# Patient Record
Sex: Male | Born: 1935 | Race: Black or African American | Hispanic: No | Smoking: Former smoker
Health system: Southern US, Community
[De-identification: ages and names within clinical notes are randomized; demographics above are authoritative.]

## PROBLEM LIST (undated history)

## (undated) DIAGNOSIS — I639 Cerebral infarction, unspecified: Secondary | ICD-10-CM

## (undated) DIAGNOSIS — Z5189 Encounter for other specified aftercare: Secondary | ICD-10-CM

## (undated) DIAGNOSIS — I10 Essential (primary) hypertension: Secondary | ICD-10-CM

## (undated) DIAGNOSIS — E785 Hyperlipidemia, unspecified: Secondary | ICD-10-CM

## (undated) DIAGNOSIS — E611 Iron deficiency: Secondary | ICD-10-CM

## (undated) HISTORY — PX: BACK SURGERY: SHX140

---

## 1998-02-14 ENCOUNTER — Emergency Department (HOSPITAL_COMMUNITY): Admission: EM | Admit: 1998-02-14 | Discharge: 1998-02-14 | Payer: Self-pay | Admitting: Emergency Medicine

## 1998-03-10 ENCOUNTER — Ambulatory Visit (HOSPITAL_COMMUNITY): Admission: RE | Admit: 1998-03-10 | Discharge: 1998-03-10 | Payer: Self-pay | Admitting: Family Medicine

## 1998-03-10 ENCOUNTER — Encounter: Payer: Self-pay | Admitting: Family Medicine

## 1998-03-17 ENCOUNTER — Encounter: Admission: RE | Admit: 1998-03-17 | Discharge: 1998-06-15 | Payer: Self-pay | Admitting: Family Medicine

## 1998-03-25 ENCOUNTER — Encounter: Payer: Self-pay | Admitting: Family Medicine

## 1998-03-25 ENCOUNTER — Ambulatory Visit (HOSPITAL_COMMUNITY): Admission: RE | Admit: 1998-03-25 | Discharge: 1998-03-25 | Payer: Self-pay | Admitting: Family Medicine

## 1998-12-27 ENCOUNTER — Emergency Department (HOSPITAL_COMMUNITY): Admission: EM | Admit: 1998-12-27 | Discharge: 1998-12-27 | Payer: Self-pay | Admitting: *Deleted

## 2000-01-10 ENCOUNTER — Emergency Department (HOSPITAL_COMMUNITY): Admission: EM | Admit: 2000-01-10 | Discharge: 2000-01-11 | Payer: Self-pay | Admitting: Emergency Medicine

## 2000-01-10 ENCOUNTER — Encounter: Payer: Self-pay | Admitting: Emergency Medicine

## 2000-08-04 ENCOUNTER — Emergency Department (HOSPITAL_COMMUNITY): Admission: EM | Admit: 2000-08-04 | Discharge: 2000-08-04 | Payer: Self-pay | Admitting: Emergency Medicine

## 2000-11-03 ENCOUNTER — Inpatient Hospital Stay (HOSPITAL_COMMUNITY): Admission: EM | Admit: 2000-11-03 | Discharge: 2000-11-07 | Payer: Self-pay | Admitting: Emergency Medicine

## 2000-11-03 ENCOUNTER — Encounter: Payer: Self-pay | Admitting: Internal Medicine

## 2001-03-08 ENCOUNTER — Emergency Department (HOSPITAL_COMMUNITY): Admission: EM | Admit: 2001-03-08 | Discharge: 2001-03-08 | Payer: Self-pay | Admitting: Emergency Medicine

## 2002-05-05 ENCOUNTER — Encounter: Payer: Self-pay | Admitting: Emergency Medicine

## 2002-05-05 ENCOUNTER — Emergency Department (HOSPITAL_COMMUNITY): Admission: EM | Admit: 2002-05-05 | Discharge: 2002-05-05 | Payer: Self-pay | Admitting: Emergency Medicine

## 2004-10-29 ENCOUNTER — Emergency Department (HOSPITAL_COMMUNITY): Admission: EM | Admit: 2004-10-29 | Discharge: 2004-10-29 | Payer: Self-pay | Admitting: Emergency Medicine

## 2010-05-30 ENCOUNTER — Encounter: Payer: Self-pay | Admitting: Urology

## 2010-12-10 ENCOUNTER — Inpatient Hospital Stay (HOSPITAL_COMMUNITY)
Admission: EM | Admit: 2010-12-10 | Discharge: 2010-12-17 | DRG: 378 | Disposition: A | Discharge status: Home / Self Care | Payer: Medicare Other | Attending: Family Medicine | Admitting: Family Medicine

## 2010-12-10 DIAGNOSIS — D126 Benign neoplasm of colon, unspecified: Secondary | ICD-10-CM | POA: Diagnosis present

## 2010-12-10 DIAGNOSIS — K449 Diaphragmatic hernia without obstruction or gangrene: Secondary | ICD-10-CM | POA: Diagnosis present

## 2010-12-10 DIAGNOSIS — K2970 Gastritis, unspecified, without bleeding: Secondary | ICD-10-CM | POA: Diagnosis present

## 2010-12-10 DIAGNOSIS — T39095A Adverse effect of salicylates, initial encounter: Secondary | ICD-10-CM | POA: Diagnosis present

## 2010-12-10 DIAGNOSIS — C61 Malignant neoplasm of prostate: Secondary | ICD-10-CM | POA: Diagnosis present

## 2010-12-10 DIAGNOSIS — I10 Essential (primary) hypertension: Secondary | ICD-10-CM | POA: Diagnosis present

## 2010-12-10 DIAGNOSIS — K922 Gastrointestinal hemorrhage, unspecified: Principal | ICD-10-CM | POA: Diagnosis present

## 2010-12-10 DIAGNOSIS — R1310 Dysphagia, unspecified: Secondary | ICD-10-CM | POA: Diagnosis present

## 2010-12-10 DIAGNOSIS — N400 Enlarged prostate without lower urinary tract symptoms: Secondary | ICD-10-CM | POA: Diagnosis present

## 2010-12-10 DIAGNOSIS — K5730 Diverticulosis of large intestine without perforation or abscess without bleeding: Secondary | ICD-10-CM | POA: Diagnosis present

## 2010-12-10 DIAGNOSIS — D62 Acute posthemorrhagic anemia: Secondary | ICD-10-CM | POA: Diagnosis present

## 2010-12-10 DIAGNOSIS — E785 Hyperlipidemia, unspecified: Secondary | ICD-10-CM | POA: Diagnosis present

## 2010-12-10 LAB — BASIC METABOLIC PANEL
BUN: 22 mg/dL (ref 6–23)
CO2: 29 mEq/L (ref 19–32)
Calcium: 9.8 mg/dL (ref 8.4–10.5)
Glucose, Bld: 102 mg/dL — ABNORMAL HIGH (ref 70–99)
Potassium: 2.9 mEq/L — ABNORMAL LOW (ref 3.5–5.1)
Sodium: 139 mEq/L (ref 135–145)

## 2010-12-10 LAB — COMPREHENSIVE METABOLIC PANEL
ALT: 5 U/L (ref 0–53)
AST: 15 U/L (ref 0–37)
Albumin: 3.9 g/dL (ref 3.5–5.2)
CO2: 28 mEq/L (ref 19–32)
Calcium: 9.6 mg/dL (ref 8.4–10.5)
Creatinine, Ser: 1.21 mg/dL (ref 0.50–1.35)
GFR calc non Af Amer: 56 mL/min — ABNORMAL LOW (ref >60)
Sodium: 138 mEq/L (ref 135–145)
Total Protein: 7.9 g/dL (ref 6.0–8.3)

## 2010-12-10 LAB — CBC
MCH: 16.5 pg — ABNORMAL LOW (ref 26.0–34.0)
MCHC: 26.7 g/dL — ABNORMAL LOW (ref 30.0–36.0)
MCV: 61.7 fL — ABNORMAL LOW (ref 78.0–100.0)
Platelets: 336 10*3/uL (ref 150–400)
RBC: 3.76 MIL/uL — ABNORMAL LOW (ref 4.22–5.81)
RDW: 22.3 % — ABNORMAL HIGH (ref 11.5–15.5)

## 2010-12-10 LAB — SAMPLE TO BLOOD BANK

## 2010-12-10 LAB — DIFFERENTIAL
Basophils Absolute: 0.1 10*3/uL (ref 0.0–0.1)
Eosinophils Absolute: 0.2 10*3/uL (ref 0.0–0.7)
Lymphs Abs: 1.4 10*3/uL (ref 0.7–4.0)
Monocytes Relative: 12 % (ref 3–12)
Neutro Abs: 3 10*3/uL (ref 1.7–7.7)

## 2010-12-10 LAB — VITAMIN B12: Vitamin B-12: 468 pg/mL (ref 211–911)

## 2010-12-10 LAB — IRON AND TIBC
Iron: 12 ug/dL — ABNORMAL LOW (ref 42–135)
Saturation Ratios: 3 % — ABNORMAL LOW (ref 20–55)
TIBC: 439 ug/dL — ABNORMAL HIGH (ref 215–435)

## 2010-12-10 LAB — ABO/RH: ABO/RH(D): B POS

## 2010-12-10 LAB — OCCULT BLOOD, POC DEVICE: Fecal Occult Bld: NEGATIVE

## 2010-12-10 LAB — FOLATE: Folate: 8.5 ng/mL

## 2010-12-11 LAB — BASIC METABOLIC PANEL
Calcium: 10 mg/dL (ref 8.4–10.5)
Creatinine, Ser: 1.05 mg/dL (ref 0.50–1.35)
GFR calc Af Amer: >60 mL/min (ref >60)
GFR calc non Af Amer: >60 mL/min (ref >60)

## 2010-12-11 LAB — CBC
MCH: 17.4 pg — ABNORMAL LOW (ref 26.0–34.0)
MCV: 64.7 fL — ABNORMAL LOW (ref 78.0–100.0)
Platelets: 307 10*3/uL (ref 150–400)
RDW: 23.9 % — ABNORMAL HIGH (ref 11.5–15.5)

## 2010-12-11 LAB — TSH: TSH: 3.721 u[IU]/mL (ref 0.350–4.500)

## 2010-12-12 ENCOUNTER — Other Ambulatory Visit: Payer: Self-pay | Admitting: Gastroenterology

## 2010-12-12 LAB — BASIC METABOLIC PANEL
BUN: 9 mg/dL (ref 6–23)
GFR calc Af Amer: >60 mL/min (ref >60)
GFR calc non Af Amer: >60 mL/min (ref >60)
Potassium: 3.9 mEq/L (ref 3.5–5.1)
Sodium: 138 mEq/L (ref 135–145)

## 2010-12-12 LAB — CBC
MCHC: 26.9 g/dL — ABNORMAL LOW (ref 30.0–36.0)
Platelets: 274 10*3/uL (ref 150–400)
RDW: 24.3 % — ABNORMAL HIGH (ref 11.5–15.5)

## 2010-12-13 LAB — CBC
HCT: 24.8 % — ABNORMAL LOW (ref 39.0–52.0)
Hemoglobin: 6.6 g/dL — CL (ref 13.0–17.0)
MCH: 17.7 pg — ABNORMAL LOW (ref 26.0–34.0)
Platelets: 260 10*3/uL (ref 150–400)
RBC: 3.72 MIL/uL — ABNORMAL LOW (ref 4.22–5.81)
RDW: 24.6 % — ABNORMAL HIGH (ref 11.5–15.5)
WBC: 4.5 10*3/uL (ref 4.0–10.5)

## 2010-12-13 LAB — BASIC METABOLIC PANEL
Chloride: 106 mEq/L (ref 96–112)
Creatinine, Ser: 0.89 mg/dL (ref 0.50–1.35)
GFR calc Af Amer: >60 mL/min (ref >60)

## 2010-12-14 ENCOUNTER — Inpatient Hospital Stay (HOSPITAL_COMMUNITY): Payer: Medicare Other

## 2010-12-14 LAB — CROSSMATCH
ABO/RH(D): B POS
Antibody Screen: NEGATIVE
Unit division: 0
Unit division: 0

## 2010-12-14 LAB — CBC
HCT: 29.3 % — ABNORMAL LOW (ref 39.0–52.0)
Hemoglobin: 8.6 g/dL — ABNORMAL LOW (ref 13.0–17.0)
MCH: 20.3 pg — ABNORMAL LOW (ref 26.0–34.0)
MCHC: 29.4 g/dL — ABNORMAL LOW (ref 30.0–36.0)
MCV: 69.3 fL — ABNORMAL LOW (ref 78.0–100.0)

## 2010-12-14 MED ORDER — IOHEXOL 300 MG/ML  SOLN
100.0000 mL | Freq: Once | INTRAMUSCULAR | Status: AC | PRN
  Administered 2010-12-14: 100 mL via INTRAVENOUS

## 2010-12-15 LAB — CBC
HCT: 32 % — ABNORMAL LOW (ref 39.0–52.0)
MCV: 69.7 fL — ABNORMAL LOW (ref 78.0–100.0)
Platelets: 274 10*3/uL (ref 150–400)
RBC: 4.59 MIL/uL (ref 4.22–5.81)
WBC: 6.8 10*3/uL (ref 4.0–10.5)

## 2010-12-15 LAB — CROSSMATCH
ABO/RH(D): B POS
Antibody Screen: NEGATIVE

## 2010-12-16 ENCOUNTER — Other Ambulatory Visit: Payer: Self-pay | Admitting: Gastroenterology

## 2010-12-16 LAB — CBC
HCT: 31.3 % — ABNORMAL LOW (ref 39.0–52.0)
Hemoglobin: 9.2 g/dL — ABNORMAL LOW (ref 13.0–17.0)
MCHC: 29.4 g/dL — ABNORMAL LOW (ref 30.0–36.0)
RBC: 4.49 MIL/uL (ref 4.22–5.81)

## 2010-12-21 NOTE — Consult Note (Signed)
NAME:  Brett Mora, Brett Mora NO.:  [id number]  MEDICAL RECORD NO.:  [id number]  LOCATION:  5504                         FACILITY:  [facility]  PHYSICIAN:  Bernette Redbird, M.D.   DATE OF BIRTH:  [birth year]  DATE OF CONSULTATION:  12/10/2010 DATE OF DISCHARGE:                                CONSULTATION   Dr. Lonia Blood of the [facility] asked [place] to see this delightful 77 year old African American male because of severe microcytic anemia.  The patient was at his primary physician's office, where he seldom goes, and was found to have a hemoglobin of 6.5, with microcytic indices. Apparently, this was a completely incidental and unexpected finding since the patient really did not report anemic symptoms.  In any event, he was sent to the hospital where the hemoglobin was confirmed to be 6.2 with an MCV of 62, elevated RDW of 22.3, and iron studies consistent with iron deficiency including ferritin level of 4 and iron saturation of 3%.  The patient does not have any localizing GI symptoms apart from intermittent dysphagia, for which he underwent an esophageal dilatation many years ago.  He does have a fair amount of aspirin and Aleve.  He denies anorexia, weight loss, dyspepsia, abdominal pain, or diarrhea but does have a slight tendency toward constipation he attributes to calcium supplements.  He has never had colonoscopic evaluation.  PAST MEDICAL HISTORY:  No known allergies.  OUTPATIENT MEDICATIONS:  Aleve, amlodipine, aspirin 2 tablets daily.  CHRONIC MEDICAL ILLNESSES:  Hypertension, history of back surgery.  HABITS:  Remote smoking, nondrinker.  FAMILY HISTORY:  No clear family history of GI processes.  His sister may have had some sort of cancer that "went to her large intestine" but details are vague.  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  REVIEW OF SYSTEMS:  Please see HPI.  Dysphagia symptoms are mildly troubling  for the patient and that it is necessary for the patient to eat soft food, chew his food well, and eat slowly.  PHYSICAL EXAMINATION:  GENERAL:  This is a remarkably well preserved 52- year-old [place] American male. HEENT:  He is hard of hearing.  He is anicteric.  There was moderate pallor of the nail beds. CHEST:  Clear. HEART:  Normal. ABDOMEN:  Without organomegaly, guarding, mass effect, or tenderness. Stool was Hemoccult negative when checked in the emergency room and was not repeated.  IMPRESSION: 1. Iron-deficiency anemia with currently heme-negative stool. 2. History of exposure to aspirin and nonsteroidals. 3. Dysphagia symptoms, recurrent, status post esophageal dilatation     many years ago.  RECOMMENDATIONS: 1. I would advocate transfusing this elderly patient up to a     hemoglobin around 8, in anticipation of sedation required for     endoscopic procedures. 2. Begin prep tomorrow. 3. Aim for endoscopy with probable esophageal dilatation and     colonoscopy 2 days from now, on Sunday.  The nature, purpose and     risks of the status were reviewed with the patient and his family,     and they are all agreeable to proceed.  I do feel he is  sufficiently symptomatic to warrant an esophageal dilatation if a     discrete stricture can be identified, but in view of his advanced     age, I would lean toward doing a conservative dilatation.  The     patient and family are aware that my covering partner, Dr. Dulce Sellar,     will most likely be the one to be performing these procedures.  We appreciate the opportunity to have seen this patient in consultation with you.          ______________________________ Bernette Redbird, M.D.     RB/MEDQ  D:  12/10/2010  T:  12/11/2010  Job:  [id number]  cc:   Merlene Laughter. Renae Gloss, M.D.  Electronically Signed by Bernette Redbird M.D. on 12/21/2010 10:58:13 AM

## 2010-12-21 NOTE — H&P (Signed)
NAME:  Brett Mora, SCHOLLMEYER NO.:  [id number]  MEDICAL RECORD NO.:  [id number]  LOCATION:  [facility]                         FACILITY:  [facility]  PHYSICIAN:  Lonia Blood, M.D.       DATE OF BIRTH:  [birth year]  DATE OF ADMISSION:  12/10/2010 DATE OF DISCHARGE:                             HISTORY & PHYSICAL   PRIMARY CARE PHYSICIAN:  Dr. Andi Devon.  CHIEF COMPLAINT:  Found to have a hemoglobin of 6.5.  HISTORY OF PRESENT ILLNESS:  Brett Mora is an 77 year old gentleman without any significant past medical history, who was found to have a hemoglobin of 6.5 at the routine CBC test in his primary care physician's office.  The patient denies any chest pain, shortness of breath, or weakness.  He reports he has had good appetite.  He has never had a colonoscopy.  He had upper endoscopy with dilatation years ago. He takes Aleve for his chronic back pain on a daily basis.  PAST MEDICAL HISTORY:  Hypertension, remote back surgery.  SOCIAL HISTORY:  He lives with his grandson.  Does not drink alcohol. He smokes cigarettes long time ago, he does not remember how long ago. He has two living children and says having grandchildren.  FAMILY HISTORY:  Mother passed away, he does not know why and father died with diabetes and peripheral vascular disease.  REVIEW OF SYSTEMS:  As per HPI.  Also, the patient is hard of hearing. He wears glasses.  He has got chronic back pain from degenerative disk disease.  Otherwise, is negative per HPI.  PHYSICAL EXAM:  VITAL SIGNS:  Upon admission, temperature 98.3, blood pressure 150/66, pulse 62, respirations 20, saturation 100% on room air. GENERAL:  The patient is alert and oriented in no acute distress. HEENT:  Head, normocephalic and atraumatic.  Eyes, pupils are equal, round, and reactive to light and accommodation.  Extraocular movements are intact. THROAT:  Clear. NECK:  Supple.  No JVD. CHEST: Clear to auscultation.  No wheezes,  rhonchi, or crackles. HEART:  Regular rate and rhythm without murmurs, rubs, or gallops. ABDOMEN:  Soft, nontender, nondistended.  Bowel sounds are present. Lower extremity without edema. MUSCULOSKELETAL:  Intact. NEUROLOGICAL:  Cranial nerves II through XII intact.  Strength 5/5 in all extremities.  Sensation intact.  LABORATORY DATA:  Laboratory values at the time of admission, white blood cell count is 5.3, hemoglobin 6.2, MCV 61, platelet count is 336,000.  Sodium 138, potassium 2.9, chloride 98, bicarbonate 28, BUN 22, creatinine 1.2, calcium 9.6, albumin 3.9, AST 15, ALT 5.  ASSESSMENT/PLAN:  This is an 77 year old gentleman with incidental finding of severe microcytic anemia.  He seems to be fairly asymptomatic.  Obviously, that is probably because he does very little exertion at home.  He does not report any overt gastrointestinal bleeding.  His Hemoccult of his rectal exam is negative.  He is currently hemodynamically stable.  My plan is to do a complete workup for his anemia including iron studies, ferritin, vitamin B12, TSH, and LDH.  The most likely explanation for this anemia is iron deficiency. This is probably due to chronic gastrointestinal losses, most likely related to NSAID  usage.  In any case, the idea will be to place the patient on observation in the hospital, transfuse him one unit pack of blood cells, monitor him overnight.  Gastroenterological consultation with Dr. Bernette Redbird will be obtained for further recommendations. For now, the aspirin and Aleve will be held.     Lonia Blood, M.D.     SL/MEDQ  D:  12/10/2010  T:  12/10/2010  Job:  [id number]  cc:   Merlene Laughter. Renae Gloss, M.D.  Electronically Signed by Lonia Blood M.D. on 12/21/2010 05:45:31 PM

## 2010-12-22 NOTE — Discharge Summary (Signed)
NAME:  Brett Mora, Brett Mora NO.:  [id number]  MEDICAL RECORD NO.:  [id number]  LOCATION:  5504                         FACILITY:  [facility]  PHYSICIAN:  Pleas Koch, MD        DATE OF BIRTH:  12-02-1919  DATE OF ADMISSION:  12/10/2010 DATE OF DISCHARGE:                              DISCHARGE SUMMARY   DISCHARGE DIAGNOSES: 1. Gastrointestinal bleed likely lower gastrointestinal bleed from     multiple polyps one showing high-grade dysplasia. 2. Symptomatic anemia secondary to anemia blood loss. 3. ?  Helicobacter pylori with chronic active gastritis. 4. 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 5. ? Benign prostatic hypertrophy versus prostate cancer followed by     line [facility].  DISCHARGE MEDICATIONS:  Are as follows: 1. Tylenol extra strength 500 mg 2 tablets daily p.r.n. 2. Trazodone 50 mg nightly p.r.n. 3. Amlodipine 5 mg 1 tablet daily.  Please make sure not to take Aleve or aspirin any more.  NEW MEDICATIONS:  Pantoprazole 40 mEq daily.  PERTINENT RADIOLOGICAL FINDINGS: 1. CT enterography of the abdomen and pelvis done December 14, 2010,     showed no evidence of small-bowel obstruction.  Normal appendix.     a.     No CT findings or complications of GI bleeding.     b.     Ancillary findings, it was noted that prostate was mildly      large measuring about 5.2 cm transverse, again mention with no      suspicious abdominopelvic lymphadenopathy.  Also, a coincidental 13 mm hepatic cyst in the inferior right hepatic lobe.  PROCEDURES DONE: 1. Colonoscopy done on December 12, 2010, showed     a.     Enlarged hiatal hernia, could be contributing to anemia.     b.     Schatzki ring highly likely source of dysphagia, dilated to      16.5 mm.     c.     Otherwise, normal endoscopy, duodenal biopsies taken to rule      out sprue. 2. Colonoscopy done December 12, 2010 showed     a.     Left colonic  diverticulosis.     b.     Diminutive colon polyp removed.     c.     No obvious source of anemia identified.  Her prep quality      was suboptimal and multiple colonic views were compromise. 3. Repeat colonoscopy done December 16, 2010, showed     a.     1-cm sessile polyp in the ascending colon that was removed      with snare polypectomy.     b.     Diverticulosis left colon.  This is a very pleasant African American male 77 years old with no significant past medical history other than possible hyperlipidemia and hypertension who presented to his primary care's office with coincidental lab findings 6.5.  He had no symptoms whatsoever chest pain, shortness of breath, weakness, good appetite.  Never had a colonoscopy.  Had upper endoscopy with dilatation couple years back.  He has also had a  cardiac cath in 2002 showing normal coronary arteries and normal left ventricular systolic function.  The patient was admitted for further workup of his anemia and it was noted that his hemoglobin was confirmed as 6.2 on admission.  Other than his potassium 2.9, his other lab findings were essentially negative.  HOSPITAL COURSE ACCORDING TO ISSUE:  Vitals on admission:  Blood pressure 150/66, pulse 62, respirations 20, sats 100% on room air. Temperature 98.3.  The patient had no significant admission findings.  1. GI bleed of unknown etiology.  The patient had both procedures     above which showed a large hiatal hernia as well as Schatzki's ring     and had two colonoscopies.  The results of the colonoscopy on the     first attempt showed high-grade dysplasia with tubular adenoma.     The biopsies of duodenum showed H. pylori with chronic active     gastritis and these issues were discussed with [facility] Dr. Dulce Sellar     who recommended to repeat colonoscopy after optimal prep.  He had a     repeat colonoscopy done on December 16, 2010, which showed a repeat     polyp and it was felt that from  gastrointestinal standpoint the     patient was stable for discharge home. 2. Hypertension.  The patient is not on any medications.  However, it     is noted that he was given prescription for losartan 50 as well as     simvastatin.  I will place him on amlodipine in the meanwhile to     control his blood pressure.  Dr. Renae Gloss is currently requested to     please follow up on his results.  I questioned the utility of Zocor     in an 89-year with possible GI malignancy, but believe this     decision to Dr. Renae Gloss. 3. Anemia blood loss, more likely the not secondary to the GI bleed. 4. Question of possible neurologic malignancy.  The patient is not on     Flomax.  The patient is also followed by [facility], he tells     me he was last seen on July 6.  I do not have any records     substantiate this.  I spoke with his daughter this morning [name],     who tells me she is not aware of any urological issues either.  As     such, I recommend the patient following up with [facility] in     the close future 2 week's time and the patient can then have be     followed up for the same.  It is unclear to me at this time whether     he is getting Lupron injections because he given an injection on     July 6. 5. Hypertension.  I placed the patient on amlodipine 5 mg and his     blood pressure is relatively moderately controlled in the hospital     currently.  On day of discharge, he was doing well.  He is very     hard of hearing likely secondary to his chronic analgesic use.  His     temperature is 97.2 pulse is 58, respirations 18, blood pressure     126/64, he is satting 96 on room air.  He has no dark stool, no     abdominal pain, no nausea and vomiting.  He had a full meal  last     night and he is very active.  He has no lower extremity tenderness     and is ambulant by himself.  The patient will be discharged home     today and needs to follow up with Dr. Renae Gloss in about 1 week.   I     would recommend getting a CBC in 5-7 days and I would recommend     close followup of polyp pathology     given the fact that he had one polyp with high-grade dysplasia.     Per USPSTF guidelines, he may need re-colonoscopy within 3-5 years.  It was pleasure taking care of this patient.          ______________________________ Pleas Koch, MD     JS/MEDQ  D:  12/17/2010  T:  12/17/2010  Job:  [id number]  cc:   Merlene Laughter. Renae Gloss, M.D. Graylin Shiver, M.D. Willis Modena, MD [facility]  Electronically Signed by Pleas Koch MD on 12/22/2010 07:39:04 AM

## 2011-01-19 NOTE — Op Note (Signed)
  NAME:  Brett Mora, Brett Mora NO.:  [id number]  MEDICAL RECORD NO.:  [id number]  LOCATION:  5504                         FACILITY:  [facility]  PHYSICIAN:  Graylin Shiver, M.D.   DATE OF BIRTH:  [birth year]  DATE OF PROCEDURE:  12/16/2010 DATE OF DISCHARGE:                              OPERATIVE REPORT   Colonoscopy with polypectomy.  INDICATIONS FOR PROCEDURE:  Iron-deficiency anemia.  Prior colonoscopy was done with findings of an adenomatous polyp that showed high-grade dysplasia, but the prep was quite poor and Dr. Dulce Sellar felt that the colonoscopy should be repeated with a better prep to make sure no other significant lesions were missed.  Informed consent was obtained after explanation of the risks of bleeding, infection and perforation.  PREMEDICATIONS: 1. Fentanyl 60 mcg IV. 2. Versed 4.5 mg IV.  PROCEDURE:  With the patient in the left lateral decubitus position, a rectal exam was performed and no masses were felt.  The Pentax colonoscope was inserted into the rectum and advanced around the colon to the cecum.  The preparation was good.  Cecal landmarks were identified.  The cecum looked normal.  In the mid ascending colon there was a 1-cm sessile polyp that was snared and removed by snare cautery technique.  The polyp was fragmented and removed.  The transverse colon looked normal.  The descending colon and sigmoid showed extensive diverticulosis.  The rectum looked normal.  He tolerated the procedure well without complications.  IMPRESSION: 1. A 1-cm sessile polyp in the ascending colon that was removed. 2. Diverticulosis of the left colon.  COMMENTS:  No other tests are planned by GI at this time.  We will check the pathology of this polyp.  I think the patient can go home on iron supplementation at this time and follow up in a couple of weeks with Dr. Dulce Sellar.          ______________________________ Graylin Shiver, M.D.     SFG/MEDQ  D:   12/16/2010  T:  12/16/2010  Job:  [id number]  cc:   [facility] Willis Modena, MD  Electronically Signed by Herbert Moors MD on 01/19/2011 08:52:30 AM

## 2011-05-16 DIAGNOSIS — C61 Malignant neoplasm of prostate: Secondary | ICD-10-CM | POA: Diagnosis not present

## 2011-06-22 DIAGNOSIS — Z79899 Other long term (current) drug therapy: Secondary | ICD-10-CM | POA: Diagnosis not present

## 2011-06-22 DIAGNOSIS — M7989 Other specified soft tissue disorders: Secondary | ICD-10-CM | POA: Diagnosis not present

## 2011-06-22 DIAGNOSIS — I10 Essential (primary) hypertension: Secondary | ICD-10-CM | POA: Diagnosis not present

## 2011-06-22 DIAGNOSIS — Z13820 Encounter for screening for osteoporosis: Secondary | ICD-10-CM | POA: Diagnosis not present

## 2011-10-31 DIAGNOSIS — Z79899 Other long term (current) drug therapy: Secondary | ICD-10-CM | POA: Diagnosis not present

## 2011-10-31 DIAGNOSIS — R269 Unspecified abnormalities of gait and mobility: Secondary | ICD-10-CM | POA: Diagnosis not present

## 2011-10-31 DIAGNOSIS — I10 Essential (primary) hypertension: Secondary | ICD-10-CM | POA: Diagnosis not present

## 2011-10-31 DIAGNOSIS — M545 Low back pain: Secondary | ICD-10-CM | POA: Diagnosis not present

## 2011-10-31 DIAGNOSIS — E785 Hyperlipidemia, unspecified: Secondary | ICD-10-CM | POA: Diagnosis not present

## 2011-11-21 DIAGNOSIS — C61 Malignant neoplasm of prostate: Secondary | ICD-10-CM | POA: Diagnosis not present

## 2011-11-24 ENCOUNTER — Other Ambulatory Visit (HOSPITAL_COMMUNITY): Payer: Self-pay | Admitting: Urology

## 2011-11-24 DIAGNOSIS — C61 Malignant neoplasm of prostate: Secondary | ICD-10-CM

## 2011-12-08 ENCOUNTER — Ambulatory Visit (HOSPITAL_COMMUNITY): Payer: Medicare Other

## 2011-12-08 ENCOUNTER — Encounter (HOSPITAL_COMMUNITY): Payer: Medicare Other

## 2011-12-27 ENCOUNTER — Ambulatory Visit (HOSPITAL_COMMUNITY): Payer: Medicare Other

## 2011-12-27 ENCOUNTER — Encounter (HOSPITAL_COMMUNITY): Payer: Medicare Other

## 2012-03-26 DIAGNOSIS — C61 Malignant neoplasm of prostate: Secondary | ICD-10-CM | POA: Diagnosis not present

## 2012-04-10 DIAGNOSIS — I10 Essential (primary) hypertension: Secondary | ICD-10-CM | POA: Diagnosis not present

## 2012-04-10 DIAGNOSIS — Z23 Encounter for immunization: Secondary | ICD-10-CM | POA: Diagnosis not present

## 2012-04-10 DIAGNOSIS — E785 Hyperlipidemia, unspecified: Secondary | ICD-10-CM | POA: Diagnosis not present

## 2012-04-10 DIAGNOSIS — D649 Anemia, unspecified: Secondary | ICD-10-CM | POA: Diagnosis not present

## 2012-04-10 DIAGNOSIS — M545 Low back pain: Secondary | ICD-10-CM | POA: Diagnosis not present

## 2012-04-10 DIAGNOSIS — E559 Vitamin D deficiency, unspecified: Secondary | ICD-10-CM | POA: Diagnosis not present

## 2012-04-10 DIAGNOSIS — Z0000 Encounter for general adult medical examination without abnormal findings: Secondary | ICD-10-CM | POA: Diagnosis not present

## 2012-05-17 DIAGNOSIS — H903 Sensorineural hearing loss, bilateral: Secondary | ICD-10-CM | POA: Diagnosis not present

## 2012-08-13 DIAGNOSIS — Z79899 Other long term (current) drug therapy: Secondary | ICD-10-CM | POA: Diagnosis not present

## 2012-08-13 DIAGNOSIS — I10 Essential (primary) hypertension: Secondary | ICD-10-CM | POA: Diagnosis not present

## 2012-08-13 DIAGNOSIS — M159 Polyosteoarthritis, unspecified: Secondary | ICD-10-CM | POA: Diagnosis not present

## 2012-09-01 ENCOUNTER — Encounter (HOSPITAL_COMMUNITY): Payer: Self-pay | Admitting: *Deleted

## 2012-09-01 ENCOUNTER — Emergency Department (HOSPITAL_COMMUNITY)
Admission: EM | Admit: 2012-09-01 | Discharge: 2012-09-01 | Disposition: A | Discharge status: Home / Self Care | Payer: Medicare Other | Attending: Emergency Medicine | Admitting: Emergency Medicine

## 2012-09-01 DIAGNOSIS — E785 Hyperlipidemia, unspecified: Secondary | ICD-10-CM | POA: Insufficient documentation

## 2012-09-01 DIAGNOSIS — S62639B Displaced fracture of distal phalanx of unspecified finger, initial encounter for open fracture: Secondary | ICD-10-CM | POA: Diagnosis not present

## 2012-09-01 DIAGNOSIS — S61209A Unspecified open wound of unspecified finger without damage to nail, initial encounter: Secondary | ICD-10-CM | POA: Diagnosis not present

## 2012-09-01 DIAGNOSIS — K089 Disorder of teeth and supporting structures, unspecified: Secondary | ICD-10-CM | POA: Diagnosis not present

## 2012-09-01 DIAGNOSIS — I10 Essential (primary) hypertension: Secondary | ICD-10-CM | POA: Diagnosis not present

## 2012-09-01 DIAGNOSIS — D509 Iron deficiency anemia, unspecified: Secondary | ICD-10-CM | POA: Insufficient documentation

## 2012-09-01 DIAGNOSIS — K120 Recurrent oral aphthae: Secondary | ICD-10-CM | POA: Insufficient documentation

## 2012-09-01 DIAGNOSIS — K1370 Unspecified lesions of oral mucosa: Secondary | ICD-10-CM | POA: Diagnosis not present

## 2012-09-01 HISTORY — DX: Iron deficiency: E61.1

## 2012-09-01 HISTORY — DX: Essential (primary) hypertension: I10

## 2012-09-01 HISTORY — DX: Hyperlipidemia, unspecified: E78.5

## 2012-09-01 MED ORDER — BENZOCAINE 10 % MT GEL: OROMUCOSAL | Status: DC | PRN

## 2012-09-01 NOTE — ED Provider Notes (Signed)
History     CSN: [id number]  Arrival date & time 09/01/12  1924   First MD Initiated Contact with Patient 09/01/12 2007      Chief Complaint  Patient presents with  . Dental Pain   Patient is a 79 y.o. male presenting with tooth pain.  Dental PainPrimary symptoms do not include fever or sore throat.  Additional symptoms do not include: trouble swallowing.   History provided by the patient and daughter. Patient with PMH of hypertension, hyperlipidemia presenting with complaints of right lower mouth sore and pain. Patient does have a partial upper denture and states symptoms are improved while wearing the denture. He first noticed pain for days ago while he had the dentures removed. Pain is worse with trying to 2 without his dentures and movement of his mouth. He denies having any associated swelling. There's been no bleeding or drainage into the mouth. Denies any pain of the remain name teeth.no associated fever, chills or sweats.patient did attempt to use some saltwater rinse without any improvements.No other aggravating or alleviating factors.     Past Medical History  Diagnosis Date  . Hypertension   . Iron deficiency   . Hyperlipemia     No past surgical history on file.  No family history on file.  History  Substance Use Topics  . Smoking status: Never Smoker   . Smokeless tobacco: Not on file  . Alcohol Use: No      Review of Systems  Constitutional: Negative for fever and chills.  HENT: Positive for mouth sores. Negative for sore throat and trouble swallowing.   All other systems reviewed and are negative.    Allergies  Review of patient's allergies indicates no known allergies.  Home Medications  No current outpatient prescriptions on file.  BP 151/71  Pulse 78  Temp(Src) 98 F (36.7 C) (Oral)  Resp 20  SpO2 95%  Physical Exam  Nursing note and vitals reviewed. Constitutional: He is oriented to person, place, and time. He appears well-developed  and well-nourished.  HENT:  Head: Normocephalic.  Ulcerative lesion to the lower right buccual mucosa adjacent to molar teeth area. no swelling or fluctuance. no swelling under the tongue.  Multiple missing teeth throughout mouth.  Neck: Normal range of motion. Neck supple.  Cardiovascular: Normal rate and regular rhythm.   Pulmonary/Chest: Effort normal and breath sounds normal. No respiratory distress. He has no wheezes.  Abdominal: Soft.  Lymphadenopathy:    He has no cervical adenopathy.  Neurological: He is alert and oriented to person, place, and time.  Skin: Skin is warm.  Psychiatric: He has a normal mood and affect. His behavior is normal.    ED Course  Procedures     1. Oral aphthous ulcer       MDM  8:05PM patient seen and evaluated. Patient well-appearing in no acute distress. Patient with complaints of pain and soreness to the right lower mouth. Patient with small ulceration to this area reproducing pain. Improved with topical benzocaine. Patient only refers to followup with his dentist.        Angus Seller, PA-C 09/01/12 2026

## 2012-09-01 NOTE — ED Provider Notes (Signed)
Medical screening examination/treatment/procedure(s) were performed by non-physician practitioner and as supervising physician I was immediately available for consultation/collaboration.  Gilda Crease, MD 09/01/12 2040

## 2012-09-01 NOTE — ED Notes (Signed)
Tooth ache - upper

## 2012-09-02 ENCOUNTER — Emergency Department (HOSPITAL_COMMUNITY)
Admission: EM | Admit: 2012-09-02 | Discharge: 2012-09-02 | Disposition: A | Discharge status: Home / Self Care | Payer: Medicare Other | Attending: Emergency Medicine | Admitting: Emergency Medicine

## 2012-09-02 ENCOUNTER — Emergency Department (HOSPITAL_COMMUNITY): Payer: Medicare Other

## 2012-09-02 ENCOUNTER — Encounter (HOSPITAL_COMMUNITY): Payer: Self-pay | Admitting: Emergency Medicine

## 2012-09-02 DIAGNOSIS — S61209A Unspecified open wound of unspecified finger without damage to nail, initial encounter: Secondary | ICD-10-CM | POA: Insufficient documentation

## 2012-09-02 DIAGNOSIS — I10 Essential (primary) hypertension: Secondary | ICD-10-CM | POA: Insufficient documentation

## 2012-09-02 DIAGNOSIS — Y92009 Unspecified place in unspecified non-institutional (private) residence as the place of occurrence of the external cause: Secondary | ICD-10-CM | POA: Insufficient documentation

## 2012-09-02 DIAGNOSIS — Z79899 Other long term (current) drug therapy: Secondary | ICD-10-CM | POA: Diagnosis not present

## 2012-09-02 DIAGNOSIS — W010XXA Fall on same level from slipping, tripping and stumbling without subsequent striking against object, initial encounter: Secondary | ICD-10-CM | POA: Insufficient documentation

## 2012-09-02 DIAGNOSIS — E785 Hyperlipidemia, unspecified: Secondary | ICD-10-CM | POA: Insufficient documentation

## 2012-09-02 DIAGNOSIS — Y9389 Activity, other specified: Secondary | ICD-10-CM | POA: Insufficient documentation

## 2012-09-02 DIAGNOSIS — S62639B Displaced fracture of distal phalanx of unspecified finger, initial encounter for open fracture: Secondary | ICD-10-CM | POA: Diagnosis not present

## 2012-09-02 DIAGNOSIS — Z23 Encounter for immunization: Secondary | ICD-10-CM | POA: Insufficient documentation

## 2012-09-02 DIAGNOSIS — D509 Iron deficiency anemia, unspecified: Secondary | ICD-10-CM | POA: Diagnosis not present

## 2012-09-02 DIAGNOSIS — S62609B Fracture of unspecified phalanx of unspecified finger, initial encounter for open fracture: Secondary | ICD-10-CM

## 2012-09-02 LAB — BASIC METABOLIC PANEL
BUN: 18 mg/dL (ref 6–23)
CO2: 26 mEq/L (ref 19–32)
Calcium: 10.2 mg/dL (ref 8.4–10.5)
Creatinine, Ser: 1.31 mg/dL (ref 0.50–1.35)
GFR calc non Af Amer: 46 mL/min — ABNORMAL LOW (ref >90)
Glucose, Bld: 98 mg/dL (ref 70–99)

## 2012-09-02 LAB — CBC WITH DIFFERENTIAL/PLATELET
Eosinophils Absolute: 0.1 10*3/uL (ref 0.0–0.7)
Eosinophils Relative: 1 % (ref 0–5)
HCT: 39.4 % (ref 39.0–52.0)
Lymphs Abs: 1.7 10*3/uL (ref 0.7–4.0)
MCH: 26.9 pg (ref 26.0–34.0)
MCV: 79.8 fL (ref 78.0–100.0)
Monocytes Absolute: 0.4 10*3/uL (ref 0.1–1.0)
Monocytes Relative: 8 % (ref 3–12)
Platelets: 199 10*3/uL (ref 150–400)
RBC: 4.94 MIL/uL (ref 4.22–5.81)

## 2012-09-02 MED ORDER — TRAMADOL HCL 50 MG PO TABS: 50.0000 mg | ORAL_TABLET | Freq: Four times a day (QID) | ORAL | Status: DC | PRN

## 2012-09-02 MED ORDER — CEFAZOLIN SODIUM 1-5 GM-% IV SOLN
1.0000 g | Freq: Once | INTRAVENOUS | Status: DC
  Filled 2012-09-02: qty 50

## 2012-09-02 MED ORDER — AMOXICILLIN-POT CLAVULANATE 875-125 MG PO TABS
1.0000 | ORAL_TABLET | Freq: Once | ORAL | Status: AC
  Administered 2012-09-02: 1 via ORAL
  Filled 2012-09-02: qty 1

## 2012-09-02 MED ORDER — LIDOCAINE HCL 2 % IJ SOLN
10.0000 mL | Freq: Once | INTRAMUSCULAR | Status: AC
  Administered 2012-09-02: 200 mg

## 2012-09-02 MED ORDER — ONDANSETRON HCL 4 MG/2ML IJ SOLN
4.0000 mg | Freq: Once | INTRAMUSCULAR | Status: DC
  Filled 2012-09-02: qty 2

## 2012-09-02 MED ORDER — AMOXICILLIN-POT CLAVULANATE 875-125 MG PO TABS: 1.0000 | ORAL_TABLET | Freq: Two times a day (BID) | ORAL | Status: DC

## 2012-09-02 MED ORDER — MORPHINE SULFATE 4 MG/ML IJ SOLN
4.0000 mg | Freq: Once | INTRAMUSCULAR | Status: AC
Start: 2012-09-02 — End: 2012-09-02
  Administered 2012-09-02: 4 mg via INTRAMUSCULAR

## 2012-09-02 MED ORDER — TETANUS-DIPHTH-ACELL PERTUSSIS 5-2.5-18.5 LF-MCG/0.5 IM SUSP
0.5000 mL | Freq: Once | INTRAMUSCULAR | Status: AC
  Administered 2012-09-02: 0.5 mL via INTRAMUSCULAR
  Filled 2012-09-02: qty 0.5

## 2012-09-02 MED ORDER — MORPHINE SULFATE 4 MG/ML IJ SOLN
4.0000 mg | Freq: Once | INTRAMUSCULAR | Status: DC
  Filled 2012-09-02: qty 1

## 2012-09-02 MED ORDER — ONDANSETRON 4 MG PO TBDP
4.0000 mg | ORAL_TABLET | Freq: Once | ORAL | Status: AC
  Administered 2012-09-02: 4 mg via ORAL
  Filled 2012-09-02: qty 1

## 2012-09-02 NOTE — ED Provider Notes (Signed)
Elderly male who fell when he tripped over his cane, has acute onset of injury to his left middle finger over the volar distal middle finger. There is no exposed bone but this is a large area of tissue that is missing. It is tender to touch. I have discussed this with Dr. Mina Marble of hand surgery who will see the patient in the office on Tuesday, he has seen the patient and recommended topical dressings nonadherent, pain control. Of scribed Augmentin twice daily until that time. The patient otherwise appears benign, x-rays pending to rule out other fractures of the finger or hand.   Medical screening examination/treatment/procedure(s) were conducted as a shared visit with non-physician practitioner(s) and myself.  I personally evaluated the patient during the encounter    Vida Roller, MD 09/02/12 [phone]

## 2012-09-02 NOTE — ED Provider Notes (Signed)
History     CSN: [id number]  Arrival date & time 09/02/12  1357   First MD Initiated Contact with Patient 09/02/12 1506      Chief Complaint  Patient presents with  . Extremity Laceration    (Consider location/radiation/quality/duration/timing/severity/associated sxs/prior treatment) HPI   Brett Mora is a 79 y.o. male past medical history significant for hypertension, iron deficiency and hyperlipidemia patient was watching TV when the doorbell ring, he got up to open a door tripped over his cane fell and had a laceration to the tip of the left middle finger. Patient's last tetanus shot is unknown. He denies any head trauma, LOC, nausea vomiting, neck pain, chest pain, shortness of breath abdominal pain difficulty ambulating. He has no amnesia prior to the event there was no prodrome of chest pain or palpitations. He states he did not syncopized at all. Last tetanus shot is unknown, patient is right-hand dominant.  Past Medical History  Diagnosis Date  . Hypertension   . Iron deficiency   . Hyperlipemia     History reviewed. No pertinent past surgical history.  No family history on file.  History  Substance Use Topics  . Smoking status: Never Smoker   . Smokeless tobacco: Not on file  . Alcohol Use: No      Review of Systems  Constitutional: Negative for fever.  Respiratory: Negative for shortness of breath.   Cardiovascular: Negative for chest pain.  Gastrointestinal: Negative for nausea, vomiting, abdominal pain and diarrhea.  All other systems reviewed and are negative.    Allergies  Review of patient's allergies indicates no known allergies.  Home Medications   Current Outpatient Rx  Name  Route  Sig  Dispense  Refill  . amLODipine (NORVASC) 5 MG tablet   Oral   Take 5 mg by mouth daily.         . benzocaine (ORAJEL) 10 % mucosal gel   Mouth/Throat   Use as directed in the mouth or throat as needed for pain.   5.3 g   0   . CALCIUM PO   Oral  Take 1 tablet by mouth daily.         . Cholecalciferol (VITAMIN D) 2000 UNITS tablet   Oral   Take 2,000 Units by mouth daily.         . ferrous fumarate (FERRO-SEQUELS) 50 MG CR tablet   Oral   Take 50 mg by mouth daily.         . hydrochlorothiazide (HYDRODIURIL) 25 MG tablet   Oral   Take 25 mg by mouth daily.         Marland Kitchen HYDROcodone-acetaminophen (NORCO/VICODIN) 5-325 MG per tablet   Oral   Take 1 tablet by mouth every 6 (six) hours as needed for pain.         Marland Kitchen levocetirizine (XYZAL) 5 MG tablet   Oral   Take 5 mg by mouth every evening.         Marland Kitchen losartan (COZAAR) 100 MG tablet   Oral   Take 100 mg by mouth daily.         . simvastatin (ZOCOR) 20 MG tablet   Oral   Take 20 mg by mouth daily.         . traMADol (ULTRAM) 50 MG tablet   Oral   Take 50 mg by mouth every 6 (six) hours as needed for pain.           BP 125/69  Pulse  81  Temp(Src) 98.8 F (37.1 C) (Oral)  Resp 16  SpO2 94%  Physical Exam  Nursing note and vitals reviewed. Constitutional: He is oriented to person, place, and time. He appears well-developed and well-nourished. No distress.  Hard of hearing   HENT:  Head: Normocephalic and atraumatic.  Mouth/Throat: Oropharynx is clear and moist.  Eyes: Conjunctivae and EOM are normal. Pupils are equal, round, and reactive to light.  Neck: Normal range of motion.  No midline tenderness to palpation or step-offs appreciated. Patient has full range of motion without pain.   Cardiovascular: Normal rate, regular rhythm and intact distal pulses.   Pulmonary/Chest: Effort normal and breath sounds normal. No stridor. No respiratory distress. He has no wheezes. He has no rales. He exhibits no tenderness.  Abdominal: Soft. Bowel sounds are normal. He exhibits no distension and no mass. There is no tenderness. There is no rebound and no guarding.  Musculoskeletal: Normal range of motion. He exhibits edema.       Hands: 3+pitting edema to  right lower extremity, 1+ pitting edema to left  Neurological: He is alert and oriented to person, place, and time.  Skin:  Avulsion to the pad of the left third digit  Psychiatric: He has a normal mood and affect.    ED Course  Procedures (including critical care time)  LACERATION REPAIR Performed by: Wynetta Emery Authorized by: Wynetta Emery Consent: Verbal consent obtained. Risks and benefits: risks, benefits and alternatives were discussed Consent given by: patient Patient identity confirmed: Wrist band  Prepped and Draped in normal sterile fashion  Tetanus: Updated  Laceration Location: volar side of left third digit distal to the DIP joint  Laceration Length: a complete avulsion of the pad of the finger  Anesthesia: digital block  Local anesthetic: 2% without epinephrine  Anesthetic total: 4 ml  Irrigation method: syringe  Amount of cleaning: 1L normal saline  Wound explored to depth in good light on a bloodless field with no foreign bodies seen or palpated, no bone visualized or palpated.   Wound dressed in bacitracin, Xeroform and gauze  Patient tolerance: Patient tolerated the procedure well with no immediate complications.  Antibx ointment applied. Instructions for care discussed verbally and patient provided with additional written instructions for homecare and f/u.  Labs Reviewed  BASIC METABOLIC PANEL - Abnormal; Notable for the following:    Potassium 3.3 (*)    GFR calc non Af Amer 46 (*)    GFR calc Af Amer 54 (*)    All other components within normal limits  CBC WITH DIFFERENTIAL   Dg Finger Middle Left  09/02/2012  *RADIOLOGY REPORT*  Clinical Data: History of fall with laceration to the left middle finger.  LEFT MIDDLE FINGER 2+V  Comparison: No priors.  Findings: There is a large soft tissue defect along the volar aspect of the distal left third digit.  There appears to be some mild fragmentation of the tuft of the third distal  phalanx, compatible with a mildly comminuted open fracture.  The remainder of the third finger is otherwise unremarkable in appearance.  IMPRESSION: 1.  Large soft tissue injury with comminuted open tuft fracture of the third distal phalanx.   Original Report Authenticated By: Trudie Reed, M.D.      Date: 09/02/2012  Rate: 74  Rhythm: normal sinus rhythm  QRS Axis: normal  Intervals: normal  ST/T Wave abnormalities: normal  Conduction Disutrbances:RBBB  Narrative Interpretation:   Old EKG Reviewed: unchanged   1. Avulsion,  finger tip, initial encounter   2. Open fracture of phalanx of hand, initial encounter       MDM   Brett Mora is a 79 y.o. male with avulsion to the finger pad of the left third digit. Wound cleaned copiously, dressed in Xeroform as per hand surgeon Dr. Mina Marble. Patient is given Augmentin, follow with the hand surgeon on Tuesday.    Filed Vitals:   09/02/12 1415 09/02/12 1531 09/02/12 1706  BP: 125/69 134/78 148/63  Pulse: 81 76 71  Temp: 98.8 F (37.1 C)  98.2 F (36.8 C)  TempSrc: Oral  Oral  Resp: 16 18 16   SpO2: 94% 96% 97%     Pt verbalized understanding and agrees with care plan. Outpatient follow-up and return precautions given.    New Prescriptions   AMOXICILLIN-CLAVULANATE (AUGMENTIN) 875-125 MG PER TABLET    Take 1 tablet by mouth every 12 (twelve) hours.   TRAMADOL (ULTRAM) 50 MG TABLET    Take 1 tablet (50 mg total) by mouth every 6 (six) hours as needed for pain.       Wynetta Emery, PA-C 09/03/12 0802  Wynetta Emery, PA-C 09/03/12 6213

## 2012-09-02 NOTE — ED Notes (Signed)
Pt. Stated, I tripped and fell and cut my left middle finger. Bleeding controlled.

## 2012-09-04 DIAGNOSIS — T148XXA Other injury of unspecified body region, initial encounter: Secondary | ICD-10-CM | POA: Diagnosis not present

## 2012-09-04 NOTE — ED Provider Notes (Signed)
Medical screening examination/treatment/procedure(s) were conducted as a shared visit with non-physician practitioner(s) and myself.  I personally evaluated the patient during the encounter  Please see my separate respective documentation pertaining to this patient encounter   Vida Roller, MD 09/04/12 1324

## 2012-09-13 DIAGNOSIS — T148XXA Other injury of unspecified body region, initial encounter: Secondary | ICD-10-CM | POA: Diagnosis not present

## 2012-09-27 DIAGNOSIS — T148XXA Other injury of unspecified body region, initial encounter: Secondary | ICD-10-CM | POA: Diagnosis not present

## 2012-10-16 DIAGNOSIS — L03039 Cellulitis of unspecified toe: Secondary | ICD-10-CM | POA: Diagnosis not present

## 2012-10-16 DIAGNOSIS — L97509 Non-pressure chronic ulcer of other part of unspecified foot with unspecified severity: Secondary | ICD-10-CM | POA: Diagnosis not present

## 2012-10-16 DIAGNOSIS — M79609 Pain in unspecified limb: Secondary | ICD-10-CM | POA: Diagnosis not present

## 2012-10-16 DIAGNOSIS — L02619 Cutaneous abscess of unspecified foot: Secondary | ICD-10-CM | POA: Diagnosis not present

## 2012-10-16 DIAGNOSIS — B351 Tinea unguium: Secondary | ICD-10-CM | POA: Diagnosis not present

## 2012-11-15 DIAGNOSIS — I10 Essential (primary) hypertension: Secondary | ICD-10-CM | POA: Diagnosis not present

## 2012-11-15 DIAGNOSIS — E785 Hyperlipidemia, unspecified: Secondary | ICD-10-CM | POA: Diagnosis not present

## 2012-11-15 DIAGNOSIS — M545 Low back pain: Secondary | ICD-10-CM | POA: Diagnosis not present

## 2012-11-15 DIAGNOSIS — Z79899 Other long term (current) drug therapy: Secondary | ICD-10-CM | POA: Diagnosis not present

## 2012-11-26 DIAGNOSIS — C61 Malignant neoplasm of prostate: Secondary | ICD-10-CM | POA: Diagnosis not present

## 2012-11-30 DIAGNOSIS — L97509 Non-pressure chronic ulcer of other part of unspecified foot with unspecified severity: Secondary | ICD-10-CM | POA: Diagnosis not present

## 2013-03-05 ENCOUNTER — Ambulatory Visit: Payer: Self-pay

## 2013-03-26 ENCOUNTER — Ambulatory Visit (INDEPENDENT_AMBULATORY_CARE_PROVIDER_SITE_OTHER): Payer: Medicare Other

## 2013-03-26 VITALS — BP 165/91 | HR 73 | Resp 28 | Ht 73.0 in | Wt 230.0 lb

## 2013-03-26 DIAGNOSIS — G609 Hereditary and idiopathic neuropathy, unspecified: Secondary | ICD-10-CM

## 2013-03-26 DIAGNOSIS — M79609 Pain in unspecified limb: Secondary | ICD-10-CM | POA: Diagnosis not present

## 2013-03-26 DIAGNOSIS — B351 Tinea unguium: Secondary | ICD-10-CM | POA: Diagnosis not present

## 2013-03-26 DIAGNOSIS — Q828 Other specified congenital malformations of skin: Secondary | ICD-10-CM

## 2013-03-26 DIAGNOSIS — L98499 Non-pressure chronic ulcer of skin of other sites with unspecified severity: Secondary | ICD-10-CM

## 2013-03-26 DIAGNOSIS — M2040 Other hammer toe(s) (acquired), unspecified foot: Secondary | ICD-10-CM

## 2013-03-26 DIAGNOSIS — L97509 Non-pressure chronic ulcer of other part of unspecified foot with unspecified severity: Secondary | ICD-10-CM | POA: Diagnosis not present

## 2013-03-26 DIAGNOSIS — I739 Peripheral vascular disease, unspecified: Secondary | ICD-10-CM

## 2013-03-26 DIAGNOSIS — G629 Polyneuropathy, unspecified: Secondary | ICD-10-CM

## 2013-03-26 MED ORDER — SILVER SULFADIAZINE 1 % EX CREA: 1.0000 "application " | TOPICAL_CREAM | Freq: Every day | CUTANEOUS | Status: DC

## 2013-03-26 NOTE — Progress Notes (Signed)
  Subjective:    Patient ID: Brett Mora, male    DOB: 1919-08-09, 79 y.o.   MRN: [id number] "Clip his toenails and take a look at his feet," stated patient's grandson.  HPI patient presents for mycotic and friable nail care 1 through 5 bilateral. However has severe distal clavus and hemorrhage keratoses second digit left foot as well as keratoses sub-fifth MTP area left foot.    Review of Systems  Respiratory: Negative.   Cardiovascular: Negative.   Endocrine: Negative.   Musculoskeletal: Negative.   Skin: Negative.   Allergic/Immunologic: Negative.   Neurological: Negative.   Hematological: Negative.   Psychiatric/Behavioral: Negative.   All other systems reviewed and are negative.       Objective:   Physical Exam Vascular status is intact although diminished pulses palpable DP +2/4 PT thready pulse one over 4 bilateral Refill time 3 seconds all digits. Skin temperature warm turgor normal mild varicosities noted. Neurologically epicritic and proprioceptive sensations intact although diminished greatly to the toes and forefoot bilateral patient has a history of some neuropathy possibly associated with anemia. Neurologically otherwise noted other findings or changes. Orthopedic biomechanical exam remarkable for rigid digital contractures 2 through 5 bilateral moderate bunion deformity lateral deviation hallux bilateral. Dermatologically skin color pigment normal hair growth absent. Nails thick brittle discolored friable 1 through 5 bilateral tender and painful both on palpation and with ambulation. There is also severe hemorrhage a keratotic distal clavus second digit left foot which on debridement reveals about a half centimeter diameter ulceration down to subcutaneous tissue level. There is also keratoses sub-fifth MTP area left secondary plantarflexed metatarsal.     Assessment & Plan:  Onychomycosis painful mycotic nails 1 through 5 bilateral debridement at this time. Lumicain and  Neosporin applied to the hallux bilateral and fourth digit left there is ulceration distal tuft of the second digit left foot which on debridement of the keratosis reveals half centimeter diameter ulcer down to subcutaneous tissue level the ulcer dressed with Silvadene and Band-Aid dressing and tube foam padding. Patient will maintain Silvadene gauze dressing daily basis instructed in soap and water cleansing daily and dressing changes daily if not improved within 2 weeks followup sooner otherwise in 3 months for long-term followup for palliative mycotic nail care. Also recommended a better choice of shoes no slip on shoes and lace up oxford such as [place] 6 or new balance athletic shoes are recommended. Return in 3 months for palliative care. No secondary infection as were noted no discharge or drainage no ascending cellulitis was identified.  Alvan Dame DPM

## 2013-03-26 NOTE — Patient Instructions (Signed)

## 2013-04-08 ENCOUNTER — Other Ambulatory Visit (HOSPITAL_COMMUNITY): Payer: Self-pay | Admitting: Urology

## 2013-04-08 DIAGNOSIS — C61 Malignant neoplasm of prostate: Secondary | ICD-10-CM

## 2013-04-18 ENCOUNTER — Encounter (HOSPITAL_COMMUNITY): Payer: Medicare Other

## 2013-06-03 DIAGNOSIS — I10 Essential (primary) hypertension: Secondary | ICD-10-CM | POA: Diagnosis not present

## 2013-06-03 DIAGNOSIS — M545 Low back pain, unspecified: Secondary | ICD-10-CM | POA: Diagnosis not present

## 2013-06-03 DIAGNOSIS — D649 Anemia, unspecified: Secondary | ICD-10-CM | POA: Diagnosis not present

## 2013-06-03 DIAGNOSIS — Z0000 Encounter for general adult medical examination without abnormal findings: Secondary | ICD-10-CM | POA: Diagnosis not present

## 2013-06-03 DIAGNOSIS — Z79899 Other long term (current) drug therapy: Secondary | ICD-10-CM | POA: Diagnosis not present

## 2013-06-03 DIAGNOSIS — E782 Mixed hyperlipidemia: Secondary | ICD-10-CM | POA: Diagnosis not present

## 2013-06-03 DIAGNOSIS — E785 Hyperlipidemia, unspecified: Secondary | ICD-10-CM | POA: Diagnosis not present

## 2013-06-03 DIAGNOSIS — E559 Vitamin D deficiency, unspecified: Secondary | ICD-10-CM | POA: Diagnosis not present

## 2013-06-25 ENCOUNTER — Ambulatory Visit: Payer: BC Managed Care – PPO

## 2013-07-09 ENCOUNTER — Ambulatory Visit: Payer: BC Managed Care – PPO

## 2013-07-11 ENCOUNTER — Inpatient Hospital Stay (HOSPITAL_COMMUNITY): Payer: Medicare Other

## 2013-07-11 ENCOUNTER — Inpatient Hospital Stay (HOSPITAL_COMMUNITY)
Admission: EM | Admit: 2013-07-11 | Discharge: 2013-07-13 | DRG: 065 | Disposition: A | Discharge status: Home Health | Payer: Medicare Other | Attending: Internal Medicine | Admitting: Internal Medicine

## 2013-07-11 ENCOUNTER — Emergency Department (HOSPITAL_COMMUNITY): Payer: Medicare Other

## 2013-07-11 ENCOUNTER — Encounter (HOSPITAL_COMMUNITY): Payer: Self-pay | Admitting: Emergency Medicine

## 2013-07-11 DIAGNOSIS — Z87891 Personal history of nicotine dependence: Secondary | ICD-10-CM

## 2013-07-11 DIAGNOSIS — R300 Dysuria: Secondary | ICD-10-CM | POA: Diagnosis not present

## 2013-07-11 DIAGNOSIS — R209 Unspecified disturbances of skin sensation: Secondary | ICD-10-CM | POA: Diagnosis present

## 2013-07-11 DIAGNOSIS — Z7982 Long term (current) use of aspirin: Secondary | ICD-10-CM | POA: Diagnosis not present

## 2013-07-11 DIAGNOSIS — R911 Solitary pulmonary nodule: Secondary | ICD-10-CM | POA: Diagnosis not present

## 2013-07-11 DIAGNOSIS — Z91199 Patient's noncompliance with other medical treatment and regimen due to unspecified reason: Secondary | ICD-10-CM

## 2013-07-11 DIAGNOSIS — Z9119 Patient's noncompliance with other medical treatment and regimen: Secondary | ICD-10-CM

## 2013-07-11 DIAGNOSIS — E785 Hyperlipidemia, unspecified: Secondary | ICD-10-CM | POA: Diagnosis present

## 2013-07-11 DIAGNOSIS — I6350 Cerebral infarction due to unspecified occlusion or stenosis of unspecified cerebral artery: Principal | ICD-10-CM

## 2013-07-11 DIAGNOSIS — E782 Mixed hyperlipidemia: Secondary | ICD-10-CM | POA: Diagnosis present

## 2013-07-11 DIAGNOSIS — E871 Hypo-osmolality and hyponatremia: Secondary | ICD-10-CM | POA: Diagnosis present

## 2013-07-11 DIAGNOSIS — I359 Nonrheumatic aortic valve disorder, unspecified: Secondary | ICD-10-CM | POA: Diagnosis not present

## 2013-07-11 DIAGNOSIS — R29818 Other symptoms and signs involving the nervous system: Secondary | ICD-10-CM | POA: Diagnosis not present

## 2013-07-11 DIAGNOSIS — Z79899 Other long term (current) drug therapy: Secondary | ICD-10-CM

## 2013-07-11 DIAGNOSIS — I10 Essential (primary) hypertension: Secondary | ICD-10-CM | POA: Diagnosis not present

## 2013-07-11 DIAGNOSIS — R29898 Other symptoms and signs involving the musculoskeletal system: Secondary | ICD-10-CM | POA: Diagnosis present

## 2013-07-11 DIAGNOSIS — I639 Cerebral infarction, unspecified: Secondary | ICD-10-CM

## 2013-07-11 DIAGNOSIS — R918 Other nonspecific abnormal finding of lung field: Secondary | ICD-10-CM | POA: Diagnosis not present

## 2013-07-11 DIAGNOSIS — R5381 Other malaise: Secondary | ICD-10-CM | POA: Diagnosis not present

## 2013-07-11 DIAGNOSIS — R269 Unspecified abnormalities of gait and mobility: Secondary | ICD-10-CM | POA: Diagnosis not present

## 2013-07-11 LAB — CBC
HCT: 39.8 % (ref 39.0–52.0)
HCT: 39.5 % (ref 39.0–52.0)
HEMOGLOBIN: 13.5 g/dL (ref 13.0–17.0)
Hemoglobin: 13.4 g/dL (ref 13.0–17.0)
MCH: 28.3 pg (ref 26.0–34.0)
MCH: 28.3 pg (ref 26.0–34.0)
MCHC: 33.9 g/dL (ref 30.0–36.0)
MCHC: 33.9 g/dL (ref 30.0–36.0)
MCV: 83.4 fL (ref 78.0–100.0)
MCV: 83.5 fL (ref 78.0–100.0)
Platelets: 215 10*3/uL (ref 150–400)
Platelets: 189 10*3/uL (ref 150–400)
RBC: 4.77 MIL/uL (ref 4.22–5.81)
RBC: 4.73 MIL/uL (ref 4.22–5.81)
RDW: 14.3 % (ref 11.5–15.5)
RDW: 14.2 % (ref 11.5–15.5)
WBC: 5.8 10*3/uL (ref 4.0–10.5)
WBC: 6 10*3/uL (ref 4.0–10.5)

## 2013-07-11 LAB — URINALYSIS, ROUTINE W REFLEX MICROSCOPIC
BILIRUBIN URINE: NEGATIVE
Glucose, UA: NEGATIVE mg/dL
Ketones, ur: NEGATIVE mg/dL
Leukocytes, UA: NEGATIVE
Nitrite: NEGATIVE
PROTEIN: NEGATIVE mg/dL
Specific Gravity, Urine: 1.016 (ref 1.005–1.030)
UROBILINOGEN UA: 0.2 mg/dL (ref 0.0–1.0)
pH: 6.5 (ref 5.0–8.0)

## 2013-07-11 LAB — COMPREHENSIVE METABOLIC PANEL
ALK PHOS: 46 U/L (ref 39–117)
ALT: 10 U/L (ref 0–53)
AST: 31 U/L (ref 0–37)
Albumin: 4.1 g/dL (ref 3.5–5.2)
BILIRUBIN TOTAL: 0.5 mg/dL (ref 0.3–1.2)
BUN: 24 mg/dL — AB (ref 6–23)
CHLORIDE: 95 meq/L — AB (ref 96–112)
CO2: 24 meq/L (ref 19–32)
Calcium: 10.1 mg/dL (ref 8.4–10.5)
Creatinine, Ser: 1.15 mg/dL (ref 0.50–1.35)
GFR calc non Af Amer: 54 mL/min — ABNORMAL LOW (ref >90)
GFR, EST AFRICAN AMERICAN: 62 mL/min — AB (ref >90)
GLUCOSE: 104 mg/dL — AB (ref 70–99)
POTASSIUM: 4.4 meq/L (ref 3.7–5.3)
Sodium: 134 mEq/L — ABNORMAL LOW (ref 137–147)
TOTAL PROTEIN: 8.5 g/dL — AB (ref 6.0–8.3)

## 2013-07-11 LAB — DIFFERENTIAL
Basophils Absolute: 0 10*3/uL (ref 0.0–0.1)
Basophils Relative: 0 % (ref 0–1)
EOS ABS: 0.1 10*3/uL (ref 0.0–0.7)
Eosinophils Relative: 2 % (ref 0–5)
Lymphocytes Relative: 30 % (ref 12–46)
Lymphs Abs: 1.7 10*3/uL (ref 0.7–4.0)
MONOS PCT: 11 % (ref 3–12)
Monocytes Absolute: 0.6 10*3/uL (ref 0.1–1.0)
NEUTROS PCT: 58 % (ref 43–77)
Neutro Abs: 3.3 10*3/uL (ref 1.7–7.7)

## 2013-07-11 LAB — PROTIME-INR
INR: 1 (ref 0.00–1.49)
Prothrombin Time: 13 seconds (ref 11.6–15.2)

## 2013-07-11 LAB — APTT: aPTT: 29 seconds (ref 24–37)

## 2013-07-11 LAB — URINE MICROSCOPIC-ADD ON

## 2013-07-11 LAB — CBG MONITORING, ED: Glucose-Capillary: 104 mg/dL — ABNORMAL HIGH (ref 70–99)

## 2013-07-11 LAB — CREATININE, SERUM
CREATININE: 1.1 mg/dL (ref 0.50–1.35)
GFR calc non Af Amer: 57 mL/min — ABNORMAL LOW (ref >90)
GFR, EST AFRICAN AMERICAN: 66 mL/min — AB (ref >90)

## 2013-07-11 LAB — I-STAT TROPONIN, ED: Troponin i, poc: 0.01 ng/mL (ref 0.00–0.08)

## 2013-07-11 MED ORDER — ENOXAPARIN SODIUM 30 MG/0.3ML ~~LOC~~ SOLN
30.0000 mg | SUBCUTANEOUS | Status: DC
  Administered 2013-07-11 – 2013-07-12 (×2): 30 mg via SUBCUTANEOUS
  Filled 2013-07-11 (×3): qty 0.3

## 2013-07-11 MED ORDER — CALCIUM CARBONATE-VITAMIN D 500-200 MG-UNIT PO TABS
1.0000 | ORAL_TABLET | Freq: Two times a day (BID) | ORAL | Status: DC
  Administered 2013-07-11 – 2013-07-13 (×4): 1 via ORAL
  Filled 2013-07-11 (×5): qty 1

## 2013-07-11 MED ORDER — ASPIRIN 300 MG RE SUPP
300.0000 mg | Freq: Every day | RECTAL | Status: DC
  Filled 2013-07-11 (×2): qty 1

## 2013-07-11 MED ORDER — CALCIUM CARBONATE-VITAMIN D 600-400 MG-UNIT PO TABS: 1.0000 | ORAL_TABLET | Freq: Three times a day (TID) | ORAL | Status: DC

## 2013-07-11 MED ORDER — PANTOPRAZOLE SODIUM 40 MG PO TBEC
40.0000 mg | DELAYED_RELEASE_TABLET | Freq: Every day | ORAL | Status: DC
  Administered 2013-07-11 – 2013-07-13 (×3): 40 mg via ORAL
  Filled 2013-07-11 (×3): qty 1

## 2013-07-11 MED ORDER — ASPIRIN 325 MG PO TABS
325.0000 mg | ORAL_TABLET | Freq: Every day | ORAL | Status: DC
  Administered 2013-07-11 – 2013-07-13 (×3): 325 mg via ORAL
  Filled 2013-07-11 (×3): qty 1

## 2013-07-11 MED ORDER — SIMVASTATIN 20 MG PO TABS
20.0000 mg | ORAL_TABLET | Freq: Every day | ORAL | Status: DC
  Administered 2013-07-11 – 2013-07-13 (×3): 20 mg via ORAL
  Filled 2013-07-11 (×3): qty 1

## 2013-07-11 MED ORDER — HYDROCODONE-ACETAMINOPHEN 5-325 MG PO TABS
1.0000 | ORAL_TABLET | Freq: Four times a day (QID) | ORAL | Status: DC | PRN
  Filled 2013-07-11: qty 1

## 2013-07-11 MED ORDER — SENNOSIDES-DOCUSATE SODIUM 8.6-50 MG PO TABS
1.0000 | ORAL_TABLET | Freq: Every evening | ORAL | Status: DC | PRN
  Administered 2013-07-11: 1 via ORAL
  Filled 2013-07-11: qty 1

## 2013-07-11 MED ORDER — HYDROCHLOROTHIAZIDE 25 MG PO TABS
25.0000 mg | ORAL_TABLET | Freq: Every day | ORAL | Status: DC
  Administered 2013-07-12 – 2013-07-13 (×3): 25 mg via ORAL
  Filled 2013-07-11 (×3): qty 1

## 2013-07-11 MED ORDER — TRAMADOL HCL 50 MG PO TABS: 50.0000 mg | ORAL_TABLET | Freq: Four times a day (QID) | ORAL | Status: DC | PRN

## 2013-07-11 MED ORDER — IRBESARTAN 150 MG PO TABS
150.0000 mg | ORAL_TABLET | Freq: Every day | ORAL | Status: DC
  Administered 2013-07-11 – 2013-07-13 (×3): 150 mg via ORAL
  Filled 2013-07-11 (×3): qty 1

## 2013-07-11 MED ORDER — AMLODIPINE BESYLATE 5 MG PO TABS
5.0000 mg | ORAL_TABLET | Freq: Every day | ORAL | Status: DC
  Administered 2013-07-11 – 2013-07-13 (×3): 5 mg via ORAL
  Filled 2013-07-11 (×3): qty 1

## 2013-07-11 NOTE — ED Notes (Signed)
Brett Mora- stroke team sts will check q30 neurochecks until 6pm.

## 2013-07-11 NOTE — ED Notes (Addendum)
Per pt he was sitting in the doctor office today around 4 pm and had sudden weakness in left arm and unable to use left hand. No other deficits noted.

## 2013-07-11 NOTE — Progress Notes (Signed)
Patient returned from MRI/CXR. Denied any pain and appears in no distress. Pt c/o hungry. Bedside swallow reevaluated and passed. Family brought food. MD paged. TELE applied. Will continue to monitor.   Ave Filter, RN

## 2013-07-11 NOTE — ED Notes (Addendum)
    Brett Mora    (Daughter)     [phone]

## 2013-07-11 NOTE — ED Notes (Signed)
Dr. Leonie Man called to ask about patient baseline to enroll in research study. RN spoke to provider and gave phone number of daughterCecille Rubin.

## 2013-07-11 NOTE — Progress Notes (Signed)
Patient arrived to room via stretcher from the ED. He is alert, denied any pain, in no apparent distress. Patient requested to eat Report from ED RN Benjamine Mola) that pt passed bedside swallow eval with heart heathy diet ordered. Family at bedside. Patient is now being transported to MRI. Will reeval bedside swallow screen and page MD when he return.  Ave Filter, RN

## 2013-07-11 NOTE — Progress Notes (Signed)
Fredirick Maudlin returned paged. No orders received at this time.   Ave Filter, RN

## 2013-07-11 NOTE — H&P (Addendum)
Hospitalist Admission History and Physical  Patient name: Brett Mora Medical record number: [id number] Date of birth: Nov 11, 1919 Age: 80 y.o. Gender: male  Primary Care Provider: Salena Saner., MD  Chief Complaint: CVA History of Present Illness:This is a 80 y.o. year old male with significant past medical history of HTN, HLD-otherwise healthy  presenting with CVA. Pt states that he was at his PCPs office today, when he noticed focal LUE weakness that persisted. Sxs started around 3pm today. Pt's cane fell on the ground and he was unable to pick it up. Had some mild LUE numbness. Denies any hemiparesis, confusion or gait instability. Denies any prior hx/o neurovascular or cardiac issues in the past. Pt brought to the ER. Code stroke called. Initial head CT negative for any acute intracranial abnormalities. NIH stroke score of 2 intitially. Sxs noted to be progressively improving during neuro eval. Working dx of likely small subcortical R cerebral infarction. Pt started on ASA. Has been noncompliant with this in the past.   Patient Active Problem List   Diagnosis Date Noted  . CVA (cerebral infarction) 07/11/2013  . Hypertension 07/11/2013   Past Medical History: Past Medical History  Diagnosis Date  . Hypertension   . Iron deficiency   . Hyperlipemia     Past Surgical History: Past Surgical History  Procedure Laterality Date  . Back surgery      Social History: History   Social History  . Marital Status: Widowed    Spouse Name: N/A    Number of Children: N/A  . Years of Education: N/A   Social History Main Topics  . Smoking status: Former Research scientist (life sciences)  . Smokeless tobacco: None  . Alcohol Use: No  . Drug Use: No  . Sexual Activity: None   Other Topics Concern  . None   Social History Narrative  . None    Family History: History reviewed. No pertinent family history.  Allergies: No Known Allergies  Current Facility-Administered Medications  Medication Dose Route  Frequency Provider Last Rate Last Dose  . amLODipine (NORVASC) tablet 5 mg  5 mg Oral Daily Shanda Howells, MD      . aspirin suppository 300 mg  300 mg Rectal Daily Shanda Howells, MD       Or  . aspirin tablet 325 mg  325 mg Oral Daily Shanda Howells, MD      . Derrill Memo ON 07/12/2013] Calcium Carbonate-Vitamin D 600-400 MG-UNIT 1 tablet  1 tablet Oral TID WC Shanda Howells, MD      . enoxaparin (LOVENOX) injection 30 mg  30 mg Subcutaneous Q24H Shanda Howells, MD      . hydrochlorothiazide (HYDRODIURIL) tablet 25 mg  25 mg Oral Daily Shanda Howells, MD      . HYDROcodone-acetaminophen (NORCO/VICODIN) 5-325 MG per tablet 1 tablet  1 tablet Oral Q6H PRN Shanda Howells, MD      . irbesartan (AVAPRO) tablet 150 mg  150 mg Oral Daily Shanda Howells, MD      . pantoprazole (PROTONIX) EC tablet 40 mg  40 mg Oral Daily Shanda Howells, MD      . senna-docusate (Senokot-S) tablet 1 tablet  1 tablet Oral QHS PRN Shanda Howells, MD      . simvastatin (ZOCOR) tablet 20 mg  20 mg Oral Daily Shanda Howells, MD      . traMADol Veatrice Bourbon) tablet 50 mg  50 mg Oral Q6H PRN Shanda Howells, MD       Current Outpatient Prescriptions  Medication Sig Dispense Refill  .  amLODipine (NORVASC) 5 MG tablet Take 5 mg by mouth daily.      . Calcium Carbonate-Vitamin D (CALCIUM 600+D) 600-400 MG-UNIT per tablet Take 1 tablet by mouth 3 (three) times daily with meals.       . Cholecalciferol (VITAMIN D PO) Take 1 tablet by mouth daily.      . hydrochlorothiazide (HYDRODIURIL) 25 MG tablet Take 25 mg by mouth daily.      Marland Kitchen HYDROcodone-acetaminophen (NORCO/VICODIN) 5-325 MG per tablet Take 1 tablet by mouth every 6 (six) hours as needed (pain).       Marland Kitchen levocetirizine (XYZAL) 5 MG tablet Take 5 mg by mouth daily.       Marland Kitchen OVER THE COUNTER MEDICATION Take 1 tablet by mouth daily. Iron with stool softener 150 mg-100 mg extended release      . pantoprazole (PROTONIX) 40 MG tablet Take 40 mg by mouth daily.      . simvastatin (ZOCOR) 20 MG tablet  Take 20 mg by mouth daily.      . traMADol (ULTRAM) 50 MG tablet Take 50 mg by mouth every 6 (six) hours as needed (pain).       . valsartan (DIOVAN) 160 MG tablet Take 160 mg by mouth daily.       Review Of Systems: 12 point ROS negative except as noted above in HPI.  Physical Exam: Filed Vitals:   07/11/13 1845  BP: 169/79  Pulse: 83  Temp:   Resp: 18    General: alert and cooperative HEENT: PERRLA and extra ocular movement intact Heart: S1, S2 normal, no murmur, rub or gallop, regular rate and rhythm Lungs: clear to auscultation, no wheezes or rales and unlabored breathing Abdomen: abdomen is soft without significant tenderness, masses, organomegaly or guarding Extremities: LUE weakness and decreased grip strength Skin:no rashes, no ecchymoses Neurology: cranial nerves 2-12 intact, persistent LUE weakness and decreased grip strength. Sensation intact.   Labs and Imaging: Lab Results  Component Value Date/Time   NA 134* 07/11/2013  4:53 PM   K 4.4 07/11/2013  4:53 PM   CL 95* 07/11/2013  4:53 PM   CO2 24 07/11/2013  4:53 PM   BUN 24* 07/11/2013  4:53 PM   CREATININE 1.15 07/11/2013  4:53 PM   GLUCOSE 104* 07/11/2013  4:53 PM   Lab Results  Component Value Date   WBC 5.8 07/11/2013   HGB 13.5 07/11/2013   HCT 39.8 07/11/2013   MCV 83.4 07/11/2013   PLT 215 07/11/2013   EKG: NSR, ventricular trigeminy   Ct Head (brain) Wo Contrast  07/11/2013   CLINICAL DATA:  Right-sided weakness.  Code stroke.  EXAM: CT HEAD WITHOUT CONTRAST  TECHNIQUE: Contiguous axial images were obtained from the base of the skull through the vertex without intravenous contrast.  COMPARISON:  None.  FINDINGS: There is no evidence of acute cortical infarct or intracranial hemorrhage. There is mild to moderate cerebral atrophy. Confluent periventricular white-matter hypodensities are nonspecific but compatible with moderate chronic small vessel ischemic disease. Punctate focal hypodensity in the right thalamus may represent  an old lacunar infarct. There is no evidence of mass, midline shift, or extra-axial fluid collection. Visualized paranasal sinuses and mastoid air cells are clear. Mild bilateral carotid siphon calcification is noted.  IMPRESSION: 1. No evidence of acute cortical infarct or intracranial hemorrhage. 2. Moderate chronic small vessel ischemic disease and likely remote right thalamic lacunar infarct. These results were called by telephone at the time of interpretation on 07/11/2013 at  5:08 PM to Dr. Pryor Curia , who verbally acknowledged these results.   Electronically Signed   By: Logan Bores   On: 07/11/2013 17:09     Assessment and Plan: Sherry Rogus is a 80 y.o. year old male presenting with CVA  CVA: proceed down ischemic stroke protocol. Risk stratification labs including TSH, A1C, lipid panel. Carotid dopplers. 2D ECHO. MRI/MRA. Start on full dose ASA.   HTN: hemodynamically stable. Continue home meds.   HLD: Check lipid panel. Continue statin.   Dysuria: Presented to PCPs office today with chief complaint of dysuria. Was not able to get this evaluated. Check UA. Obtain urine culture. Start on keflex if sxs of infection on UA.   FEN/GI: Noted mild hyponatremia and elevated BUN. mildy dry on exam. Gentle hydration and reassess in am.  Prophylaxis: lovenox  Disposition: pending further evaluation.  Code Status:Full Code.        Shanda Howells MD  Pager: [phone]

## 2013-07-11 NOTE — Code Documentation (Addendum)
80 year old patient who went to his PCP with sx of UTI.  Grandson reports patient went back to room at 1500 and at 1548 they called him back to see patient who had onset of left hand weakness and left facial droop.  Patient then presents to ED via private vehicle.  See flowsheet for times.   Dr. Nicole Kindred present.  NIHSS is 2 - weak left hand with slight drift and left facial droop.  Family reports that patient is very alert at home but has declined in health last 6 months - little walking and has had tremors with hands.  Sx mild - handoff to RN - in window until 6 pm - repage if sx worsen.

## 2013-07-11 NOTE — Consult Note (Signed)
Referring Physician: Dr. Leonides Schanz    Chief Complaint: New-onset left-sided weakness.  HPI: Brett Mora is an 80 y.o. male with a history of hypertension and hyperlipidemia who developed acute onset of weakness involving his left upper extremity as well as left side of his face at 3 PM today. Has no previous history of stroke nor TIA. Patient has not been compliant with taking aspirin daily. CT scan of his head showed no acute intracranial abnormality. NIH stroke score was 2.  LSN: 3 PM on 07/11/2013 tPA Given: No: Mild deficits only with NIH stroke score of 2 MRankin: 2  Past Medical History  Diagnosis Date  . Hypertension   . Iron deficiency   . Hyperlipemia     History reviewed. No pertinent family history.   Medications: I have reviewed the patient's current medications.  ROS: History obtained from child and the patient  General ROS: negative for - chills, fatigue, fever, night sweats, weight gain or weight loss Psychological ROS: negative for - behavioral disorder, hallucinations, memory difficulties, mood swings or suicidal ideation Ophthalmic ROS: negative for - blurry vision, double vision, eye pain or loss of vision ENT ROS: negative for - epistaxis, nasal discharge, oral lesions, sore throat, tinnitus or vertigo Allergy and Immunology ROS: negative for - hives or itchy/watery eyes Hematological and Lymphatic ROS: negative for - bleeding problems, bruising or swollen lymph nodes Endocrine ROS: negative for - galactorrhea, hair pattern changes, polydipsia/polyuria or temperature intolerance Respiratory ROS: negative for - cough, hemoptysis, shortness of breath or wheezing Cardiovascular ROS: negative for - chest pain, dyspnea on exertion, edema or irregular heartbeat Gastrointestinal ROS: negative for - abdominal pain, diarrhea, hematemesis, nausea/vomiting or stool incontinence Genito-Urinary ROS: negative for - dysuria, hematuria, incontinence or urinary  frequency/urgency Musculoskeletal ROS: negative for - joint swelling or muscular weakness Neurological ROS: as noted in HPI Dermatological ROS: negative for rash and skin lesion changes  Physical Examination: Blood pressure 164/81, pulse 71, temperature 98.5 F (36.9 C), temperature source Oral, resp. rate 16, SpO2 95.00%.  Neurologic Examination: Mental Status: Alert, oriented, thought content appropriate.  Speech fluent without evidence of aphasia. Able to follow commands without difficulty. Cranial Nerves: II-Visual fields were normal. III/IV/VI-Pupils were equal and reacted. Extraocular movements were full and conjugate.    V/VII-no facial numbness; mild left lower facial weakness. VIII-moderately severe bilateral hearing loss X-normal speecht. XII-midline tongue extension Motor: Mild drift of left upper extremity with extension, as well as moderate weakness involving left hand. Sensory: Normal throughout. Deep Tendon Reflexes: Absent throughout. Plantars: Mute bilaterally Cerebellar: Normal finger-to-nose testing. Carotid auscultation: Normal  Ct Head (brain) Wo Contrast  07/11/2013   CLINICAL DATA:  Right-sided weakness.  Code stroke.  EXAM: CT HEAD WITHOUT CONTRAST  TECHNIQUE: Contiguous axial images were obtained from the base of the skull through the vertex without intravenous contrast.  COMPARISON:  None.  FINDINGS: There is no evidence of acute cortical infarct or intracranial hemorrhage. There is mild to moderate cerebral atrophy. Confluent periventricular white-matter hypodensities are nonspecific but compatible with moderate chronic small vessel ischemic disease. Punctate focal hypodensity in the right thalamus may represent an old lacunar infarct. There is no evidence of mass, midline shift, or extra-axial fluid collection. Visualized paranasal sinuses and mastoid air cells are clear. Mild bilateral carotid siphon calcification is noted.  IMPRESSION: 1. No evidence of acute  cortical infarct or intracranial hemorrhage. 2. Moderate chronic small vessel ischemic disease and likely remote right thalamic lacunar infarct. These results were called by telephone at  the time of interpretation on 07/11/2013 at 5:08 PM to Dr. Pryor Curia , who verbally acknowledged these results.   Electronically Signed   By: Logan Bores   On: 07/11/2013 17:09    Assessment: 80 y.o. male with multiple risk factors for stroke presenting with probable small subcortical right cerebral infarction.  Stroke Risk Factors - family history, hyperlipidemia and hypertension  Plan: 1. HgbA1c, fasting lipid panel 2. MRI, MRA  of the brain without contrast 3. PT consult, OT consult, Speech consult 4. Echocardiogram 5. Carotid dopplers 6. Prophylactic therapy-Antiplatelet med: Aspirin  7. Risk factor modification 8. Telemetry monitoring  C.R. Nicole Kindred, MD [facility] [phone]  07/11/2013, 6:24 PM

## 2013-07-11 NOTE — ED Provider Notes (Signed)
TIME SEEN: 4:45 PM  CHIEF COMPLAINT: Code stroke  HPI: Patient is a 80 year old male with a history of hypertension, hyperlipidemia who presents emergency Department with left upper extremity weakness and numbness that started at 4 PM today. Patient denies any chest pain or shortness of breath. No pain in his arm. No numbness or weakness anywhere else. No vision or hearing changes. No history of head injury. He is not on anticoagulants.  ROS: See HPI Constitutional: no fever  Eyes: no drainage  ENT: no runny nose   Cardiovascular:  no chest pain  Resp: no SOB  GI: no vomiting GU: no dysuria Integumentary: no rash  Allergy: no hives  Musculoskeletal: no leg swelling  Neurological: no slurred speech ROS otherwise negative  PAST MEDICAL HISTORY/PAST SURGICAL HISTORY:  Past Medical History  Diagnosis Date  . Hypertension   . Iron deficiency   . Hyperlipemia     MEDICATIONS:  Prior to Admission medications   Medication Sig Start Date End Date Taking? Authorizing Provider  amLODipine (NORVASC) 5 MG tablet Take 5 mg by mouth daily.    Historical Provider, MD  benzocaine (ORAJEL) 10 % mucosal gel Use as directed in the mouth or throat as needed for pain. 09/01/12   Martie Lee, PA-C  CALCIUM PO Take 1 tablet by mouth daily.    Historical Provider, MD  Cholecalciferol (VITAMIN D) 2000 UNITS tablet Take 2,000 Units by mouth daily.    Historical Provider, MD  ferrous fumarate (FERRO-SEQUELS) 50 MG CR tablet Take 50 mg by mouth daily.    Historical Provider, MD  hydrochlorothiazide (HYDRODIURIL) 25 MG tablet Take 25 mg by mouth daily.    Historical Provider, MD  HYDROcodone-acetaminophen (NORCO/VICODIN) 5-325 MG per tablet Take 1 tablet by mouth every 6 (six) hours as needed for pain.    Historical Provider, MD  levocetirizine (XYZAL) 5 MG tablet Take 5 mg by mouth every evening.    Historical Provider, MD  losartan (COZAAR) 100 MG tablet Take 100 mg by mouth daily.    Historical  Provider, MD  silver sulfADIAZINE (SILVADENE) 1 % cream Apply 1 application topically daily. 03/26/13   Harriet Masson, DPM  simvastatin (ZOCOR) 20 MG tablet Take 20 mg by mouth daily.    Historical Provider, MD  traMADol (ULTRAM) 50 MG tablet Take 50 mg by mouth every 6 (six) hours as needed for pain.    Historical Provider, MD  traMADol (ULTRAM) 50 MG tablet Take 1 tablet (50 mg total) by mouth every 6 (six) hours as needed for pain. 09/02/12   Elmyra Ricks Pisciotta, PA-C    ALLERGIES:  No Known Allergies  SOCIAL HISTORY:  History  Substance Use Topics  . Smoking status: Former Research scientist (life sciences)  . Smokeless tobacco: Not on file  . Alcohol Use: No    FAMILY HISTORY: History reviewed. No pertinent family history.  EXAM: BP 155/73  Pulse 83  Temp(Src) 98.7 F (37.1 C) (Oral)  Resp 18  SpO2 98% CONSTITUTIONAL: Alert and oriented and responds appropriately to questions. Well-appearing; well-nourished HEAD: Normocephalic EYES: Conjunctivae clear, PERRL ENT: normal nose; no rhinorrhea; moist mucous membranes; pharynx without lesions noted NECK: Supple, no meningismus, no LAD  CARD: RRR; S1 and S2 appreciated; no murmurs, no clicks, no rubs, no gallops RESP: Normal chest excursion without splinting or tachypnea; breath sounds clear and equal bilaterally; no wheezes, no rhonchi, no rales,  ABD/GI: Normal bowel sounds; non-distended; soft, non-tender, no rebound, no guarding BACK:  The back appears normal and is non-tender  to palpation, there is no CVA tenderness EXT: Normal ROM in all joints; non-tender to palpation; no edema; normal capillary refill; no cyanosis    SKIN: Normal color for age and race; warm NEURO: Patient unable to lift his left arm against gravity but otherwise is normal strength in his other 3 extremities, sensation slightly diminished to light touch in his left upper extremity, cranial nerves II through XII intact, NIH stroke scale is 3 PSYCH: The patient's mood and manner are  appropriate. Grooming and personal hygiene are appropriate.  MEDICAL DECISION MAKING: Patient here with acute symptoms of left upper extremity weakness and numbness. His stroke scale is 3. He is hemodynamically stable. Protecting his airway. Able to handle his secretions. CT head shows no acute changes. His symptoms are slowly improving. Neurology, Dr. Nicole Kindred, is at bedside.  ED PROGRESS: Labs are unremarkable. CT had showed no acute abnormalities. Patient is stable. We'll discuss with neurology for disposition.   Neurology does not feel patient is a TPA candidate given his low NIH stroke scale in his improvement of his symptoms. Discussed with hospitalist for admission.    EKG Interpretation  Date/Time:  Thursday July 11 2013 17:09:52 EST Ventricular Rate:  82 PR Interval:  64 QRS Duration: 155 QT Interval:  504 QTC Calculation: 589 R Axis:   65 Text Interpretation:  Normal sinus rhythm Ventricular trigeminy Short PR interval Confirmed by WARD,  DO, KRISTEN [phone]) on 07/11/2013 5:23:09 PM        [place], DO 07/11/13 2341

## 2013-07-12 DIAGNOSIS — I359 Nonrheumatic aortic valve disorder, unspecified: Secondary | ICD-10-CM

## 2013-07-12 DIAGNOSIS — I10 Essential (primary) hypertension: Secondary | ICD-10-CM

## 2013-07-12 LAB — LIPID PANEL
Cholesterol: 145 mg/dL (ref 0–200)
HDL: 57 mg/dL (ref >39)
LDL CALC: 76 mg/dL (ref 0–99)
TRIGLYCERIDES: 62 mg/dL (ref <150)
Total CHOL/HDL Ratio: 2.5 RATIO
VLDL: 12 mg/dL (ref 0–40)

## 2013-07-12 LAB — HEMOGLOBIN A1C
HEMOGLOBIN A1C: 6.1 % — AB (ref <5.7)
Mean Plasma Glucose: 128 mg/dL — ABNORMAL HIGH (ref <117)

## 2013-07-12 MED ORDER — ASPIRIN 325 MG PO TABS: 325.0000 mg | ORAL_TABLET | Freq: Every day | ORAL | Status: DC

## 2013-07-12 NOTE — Progress Notes (Signed)
*  PRELIMINARY RESULTS* Vascular Ultrasound Carotid Duplex (Doppler) has been completed.  Findings suggest 1-39% internal carotid artery stenosis bilaterally. Vertebral arteries are patent with antegrade flow.  07/12/2013 11:50 AM Maudry Mayhew, RVT, RDCS, RDMS

## 2013-07-12 NOTE — Progress Notes (Signed)
Stroke Team Progress Note  HISTORY Brett Mora is a 80 y.o. male with a history of hypertension and hyperlipidemia who developed acute onset of weakness involving his left upper extremity as well as the left side of his face at 3 PM on 07/11/2013. No previous history of stroke nor TIA. Patient had not been compliant with taking aspirin daily. CT scan of his head showed no acute intracranial abnormality. NIH stroke score was 2.   LSN: 3 PM on 07/11/2013  tPA Given: No: Mild deficits only with NIH stroke score of 2  MRankin: 2   SUBJECTIVE Multiple family members present. The patient voices no complaints. Dr. Leonie Man discussed possible participation in the [place] Most recent Vital Signs: Filed Vitals:   07/12/13 0403 07/12/13 0557 07/12/13 0814 07/12/13 1017  BP: 152/63 150/67 158/103 159/75  Pulse: 58 56 80 60  Temp: 97.9 F (36.6 C) 98 F (36.7 C) 98.6 F (37 C) 98.2 F (36.8 C)  TempSrc: Oral Oral Oral Oral  Resp: 18 18 20 20   Height:      Weight:      SpO2: 96% 96% 98% 97%   CBG (last 3)   Recent Labs  07/11/13 1738  GLUCAP 104*    IV Fluid Intake:     MEDICATIONS  . amLODipine  5 mg Oral Daily  . aspirin  300 mg Rectal Daily   Or  . aspirin  325 mg Oral Daily  . calcium-vitamin D  1 tablet Oral BID  . enoxaparin (LOVENOX) injection  30 mg Subcutaneous Q24H  . hydrochlorothiazide  25 mg Oral Daily  . irbesartan  150 mg Oral Daily  . pantoprazole  40 mg Oral Daily  . simvastatin  20 mg Oral Daily   PRN:  HYDROcodone-acetaminophen, senna-docusate, traMADol  Diet:  Cardiac thin liquids Activity:  As tolerated DVT Prophylaxis:  Lovenox  CLINICALLY SIGNIFICANT STUDIES Basic Metabolic Panel:  Recent Labs Lab 07/11/13 1653 07/11/13 1918  NA 134*  --   K 4.4  --   CL 95*  --   CO2 24  --   GLUCOSE 104*  --   BUN 24*  --   CREATININE 1.15 1.10  CALCIUM 10.1  --    Liver Function Tests:  Recent Labs Lab 07/11/13 1653  AST 31  ALT  10  ALKPHOS 46  BILITOT 0.5  PROT 8.5*  ALBUMIN 4.1   CBC:  Recent Labs Lab 07/11/13 1653 07/11/13 1918  WBC 5.8 6.0  NEUTROABS 3.3  --   HGB 13.5 13.4  HCT 39.8 39.5  MCV 83.4 83.5  PLT 215 189   Coagulation:  Recent Labs Lab 07/11/13 1653  LABPROT 13.0  INR 1.00   Cardiac Enzymes: No results found for this basename: CKTOTAL, CKMB, CKMBINDEX, TROPONINI,  in the last 168 hours Urinalysis:  Recent Labs Lab 07/11/13 1852  COLORURINE YELLOW  LABSPEC 1.016  PHURINE 6.5  GLUCOSEU NEGATIVE  HGBUR TRACE*  BILIRUBINUR NEGATIVE  KETONESUR NEGATIVE  PROTEINUR NEGATIVE  UROBILINOGEN 0.2  NITRITE NEGATIVE  LEUKOCYTESUR NEGATIVE   Lipid Panel    Component Value Date/Time   CHOL 145 07/12/2013 0351   TRIG 62 07/12/2013 0351   HDL 57 07/12/2013 0351   CHOLHDL 2.5 07/12/2013 0351   VLDL 12 07/12/2013 0351   LDLCALC 76 07/12/2013 0351   HgbA1C  Lab Results  Component Value Date   HGBA1C 6.1* 07/12/2013    Urine Drug Screen:   No results found for  this basename: labopia, cocainscrnur, labbenz, amphetmu, thcu, labbarb    Alcohol Level: No results found for this basename: ETH,  in the last 168 hours  Dg Chest 2 View 07/11/2013    1. Cardiomegaly without failure. 2. Hiatal hernia. 3. Possible 7 mm nodule in the right lung.    Ct Head (brain) Wo Contrast 07/11/2013    1. No evidence of acute cortical infarct or intracranial hemorrhage. 2. Moderate chronic small vessel ischemic disease and likely remote right thalamic lacunar infarct.     Mr Brain Wo Contrast 07/11/2013    MRI BRAIN:  1. Acute ischemic infarcts involving the cortical gray matter and subcortical white matter of the right frontal lobe as above. No evidence of hemorrhagic conversion or significant mass effect. 2. Moderate age-related atrophy and chronic microvascular ischemic disease. 3. Subcentimeter remote lacunar infarcts within the left basal ganglia and right thalamus.  MRA HEAD:  1. Normal MRA of the brain  without evidence of high-grade flow-limiting stenosis, proximal branch occlusion, or other acute vascular abnormality. 2. No intracranial aneurysm.      2D Echocardiogram  pending  Carotid Doppler  Findings suggest 1-39% internal carotid artery stenosis bilaterally. Vertebral arteries are patent with antegrade flow.    EKG - normal sinus rhythm rate 82 beats per minute. For complete results please see formal report.   Therapy Recommendations home health physical therapy.  Physical Exam   Awake alert. Afebrile. Head is nontraumatic. Neck is supple without bruit. Hearing is normal. Cardiac exam no murmur or gallop. Lungs are clear to auscultation. Distal pulses are well felt. Neurologic Examination:  Mental Status:  Alert, oriented, thought content appropriate. Speech fluent without evidence of aphasia. Able to follow commands without difficulty.  Cranial Nerves:  II-Visual fields were normal.  III/IV/VI-Pupils were equal and reacted. Extraocular movements were full and conjugate.  V/VII-no facial numbness; mild left lower facial weakness.  VIII-moderately severe bilateral hearing loss  X-normal speecht.  XII-midline tongue extension  Motor: Mild drift of left upper extremity with extension, as well as moderate weakness involving left hand. Normal strength in LE bilaterally Sensory: Normal throughout.  Deep Tendon Reflexes: Absent throughout.  Plantars: Mute bilaterally  Cerebellar: Normal finger-to-nose testing.  Carotid auscultation: Normal   ASSESSMENT Brett Mora is a 80 y.o. male presenting with left upper extremity weakness. TPA was not administered as the patient's deficits were minimal. MRI acute ischemic infarcts involving the cortical gray matter and subcortical white matter of the right frontal lobe. Infarcts felt to be secondary to small vessel disease. On Aspirin dosage unknown prior to admission; however the patient was noncompliant in taking the aspirin. Now on aspirin  325 mg orally every day for secondary stroke prevention. Patient with resultant mild left upper extremity paresis. Stroke work up underway.   Hypertension  Hyperlipidemia - cholesterol 145 LDL 76 - on simvastatin prior to admission and currently.  Previous CVAs  Pulmonary nodule by chest x-ray - will need further followup - previous tobacco history.   Hospital day # 1  TREATMENT/PLAN  Continue aspirin 325 mg orally every day for secondary stroke prevention.  Await 2-D echo   Home health physical therapy recommended  Will need further followup for pulmonary nodule.  Possible participation in [place] discharge tomorrow  Mikey Bussing PA-C [facility] Pager [phone] 07/12/2013, 11:52 AM  I have personally obtained a history, examined the patient, evaluated imaging results, and formulated the assessment and plan of care. I agree with  the above. Antony Contras, MD

## 2013-07-12 NOTE — Discharge Instructions (Signed)
DO NOT DRIVE UNLESS YOU ARE CLEARED BY YOUR PRIMARY CARE DOCTOR FOR DRIVING.  You will need follow up with your primary care physician for possible lung nodule that was seen on chest xray.  They may want to get a CT scan.

## 2013-07-12 NOTE — Progress Notes (Signed)
   CARE MANAGEMENT NOTE 07/12/2013  Patient:  Brett Mora, Brett Mora   Account Number:  [id number]  Date Initiated:  07/12/2013  Documentation initiated by:  Olga Coaster  Subjective/Objective Assessment:   ADMITTED FOR STROKE WORKUP     Action/Plan:   CM FOLLOWING FOR DCP   Anticipated DC Date:  07/17/2013   Anticipated DC Plan:  [place]  CM consult         Status of service:  In process, will continue to follow Medicare Important Message given?  NA - LOS <3 / Initial given by admissions (If response is "NO", the following Medicare IM given date fields will be blank)  Per UR Regulation:  Reviewed for med. necessity/level of care/duration of stay  Comments:  3/6/2015Mindi Slicker RN,BSN,MHA [phone]

## 2013-07-12 NOTE — Evaluation (Signed)
Speech Language Pathology Evaluation Patient Details Name: Avion Kutzer MRN: [id number] DOB: Jan 01, 1920 Today's Date: 07/12/2013 Time: 1415-     Problem List:  Patient Active Problem List   Diagnosis Date Noted  . CVA (cerebral infarction) 07/11/2013  . Hypertension 07/11/2013   Past Medical History:  Past Medical History  Diagnosis Date  . Hypertension   . Iron deficiency   . Hyperlipemia    Past Surgical History:  Past Surgical History  Procedure Laterality Date  . Back surgery     HPI:  History of Present Illness:This is a 80 y.o. year old male with significant past medical history of HTN, HLD-otherwise healthy  presenting with CVA. Pt states that he was at his PCPs office today, when he noticed focal LUE weakness that persisted. Sxs started around 3pm today. Pt's cane fell on the ground and he was unable to pick it up. Had some mild LUE numbness. Denies any hemiparesis, confusion or gait instability. Denies any prior hx/o neurovascular or cardiac issues in the past. Pt brought to the ER. Code stroke called. Initial head CT negative for any acute intracranial abnormalities. NIH stroke score of 2 intitially. Sxs noted to be progressively improving during neuro eval. Working dx of likely small subcortical R cerebral infarction. Pt started on ASA. Has been noncompliant with this in the past   Assessment / Plan / Recommendation Clinical Impression  Speech, language, and cognition appear to be within normal limits. No f/u ST needed at this time.    SLP Assessment  Patient does not need any further Speech Lanaguage Pathology Services    Follow Up Recommendations  24 hour supervision/assistance    Frequency and Duration        Pertinent Vitals/Pain n/a   SLP Goals  SLP Goals Progress/Goals/Alternative treatment plan discussed with pt/caregiver and they: Agree  SLP Evaluation Prior Functioning  Cognitive/Linguistic Baseline: Within functional limits Type of Home: House  Lives  With: Family [name]) Available Help at Discharge: Family Vocation: Retired   Associate Professor  Overall Cognitive Status: Within Functional Limits for tasks assessed Arousal/Alertness: Awake/alert Orientation Level: Oriented X4 Memory: Appears intact Awareness: Appears intact Problem Solving: Appears intact Safety/Judgment: Appears intact    Comprehension  Auditory Comprehension Overall Auditory Comprehension: Appears within functional limits for tasks assessed Yes/No Questions: Within Functional Limits Commands: Within Functional Limits Conversation: Complex Reading Comprehension Reading Status: Not tested    Expression Expression Primary Mode of Expression: Verbal Verbal Expression Overall Verbal Expression: Appears within functional limits for tasks assessed Initiation: No impairment Level of Generative/Spontaneous Verbalization: Conversation Repetition: No impairment Naming: No impairment Pragmatics: No impairment Written Expression Dominant Hand: Right Written Expression: Not tested   Oral / Motor Oral Motor/Sensory Function Overall Oral Motor/Sensory Function: Appears within functional limits for tasks assessed Motor Speech Overall Motor Speech: Appears within functional limits for tasks assessed Respiration: Within functional limits Phonation: Normal Resonance: Within functional limits Articulation: Within functional limitis Intelligibility: Intelligible Motor Planning: Witnin functional limits   GO     Quinn Axe T 07/12/2013, 2:28 PM

## 2013-07-12 NOTE — Evaluation (Signed)
Physical Therapy Evaluation Patient Details Name: Brett Mora MRN: [id number] DOB: 04-Nov-1919 Today's Date: 07/12/2013 Time: [id number] PT Time Calculation (min): 19 min  PT Assessment / Plan / Recommendation History of Present Illness  Brett Mora is an 80 y.o. male with a history of hypertension and hyperlipidemia who developed acute onset of weakness involving his left upper extremity as well as left side of his face at 3 PM today. Has no previous history of stroke nor TIA. Patient has not been compliant with taking aspirin daily. CT scan of his head showed no acute intracranial abnormality. NIH stroke score was 2. MRI did reveal Acute ischemic infarcts involving the cortical gray matter and subcortical white matter of the right frontal lobe.   Clinical Impression  Patient demonstrates deficit in functional mobility as indicated below. Will need continued skilled PT to address deficits and maximize function. Will see as indicated and progress as tolerated.     PT Assessment  Patient needs continued PT services    Follow Up Recommendations  Home health PT;Supervision/Assistance - 24 hour    Does the patient have the potential to tolerate intense rehabilitation      Barriers to Discharge        Equipment Recommendations  Rolling walker with 5" wheels    Recommendations for Other Services     Frequency Min 4X/week    Precautions / Restrictions Precautions Precautions: Fall   Pertinent Vitals/Pain No pain at this time      Mobility  Bed Mobility Overal bed mobility: Modified Independent General bed mobility comments: increased time to perform, no physical assist needed Transfers Overall transfer level: Needs assistance Equipment used: Rolling walker (2 wheeled) Transfers: Sit to/from Stand Sit to Stand: Min assist General transfer comment: Patient with increased flexed posture upon standing, assist to stabilize and come to upright position, decreased ability to keep L hand on  RW, manual assist to hold hand onto RW Ambulation/Gait Ambulation/Gait assistance: Min assist Ambulation Distance (Feet): 140 Feet Assistive device: Rolling walker (2 wheeled) Gait Pattern/deviations: Step-through pattern;Decreased stride length;Trunk flexed Gait velocity: decreased Gait velocity interpretation: Below normal speed for age/gender General Gait Details: Patient with significantly flexed posture during ambulation, max cues for upright posture. Difficulty maintaining grip L side.  may benefit from built up handle. Modified Rankin (Stroke Patients Only) Pre-Morbid Rankin Score: Moderate disability Modified Rankin: Moderately severe disability    Exercises     PT Diagnosis: Difficulty walking;Abnormality of gait  PT Problem List: Decreased strength;Decreased range of motion;Decreased activity tolerance;Decreased balance;Decreased mobility;Impaired sensation PT Treatment Interventions: DME instruction;Gait training;Stair training;Functional mobility training;Therapeutic activities;Therapeutic exercise;Balance training;Patient/family education     PT Goals(Current goals can be found in the care plan section) Acute Rehab PT Goals Patient Stated Goal: to go home PT Goal Formulation: With patient Time For Goal Achievement: 07/26/13 Potential to Achieve Goals: Fair  Visit Information  Last PT Received On: 07/12/13 Assistance Needed: +1 History of Present Illness: Brett Mora is an 80 y.o. male with a history of hypertension and hyperlipidemia who developed acute onset of weakness involving his left upper extremity as well as left side of his face at 3 PM today. Has no previous history of stroke nor TIA. Patient has not been compliant with taking aspirin daily. CT scan of his head showed no acute intracranial abnormality. NIH stroke score was 2. MRI did reveal Acute ischemic infarcts involving the cortical gray matter and subcortical white matter of the right frontal lobe.  Prior [place] expects to be discharged to:: Private residence Living Arrangements: Other relatives Available Help at Discharge:  (granson and 2 daughters) Type of Home: House Home Access: Stairs to enter Technical brewer of Steps: 3 Entrance Stairs-Rails:  (broken hand rail) Home Layout: One level [place]: [place] - single point Prior Function Level of Independence: Independent with assistive device(s) Comments: cane Communication Communication: HOH Dominant Hand: Right    Cognition  Cognition Arousal/Alertness: Awake/alert Behavior During Therapy: WFL for tasks assessed/performed Overall Cognitive Status: Within Functional Limits for tasks assessed    Extremity/Trunk Assessment Upper Extremity Assessment Upper Extremity Assessment: LUE deficits/detail LUE Deficits / Details: decreased grip and strength  LUE Sensation: decreased light touch;decreased proprioception Lower Extremity Assessment Lower Extremity Assessment: Overall WFL for tasks assessed      End of Session PT - End of Session Equipment Utilized During Treatment: Gait belt Activity Tolerance: Patient tolerated treatment well Patient left: with call bell/phone within reach;in bed;with bed alarm set Nurse Communication: Mobility status  GP     Brett Mora 07/12/2013, 9:14 AM Brett Mora, [place] DPT  [phone]

## 2013-07-12 NOTE — Progress Notes (Signed)
Talked to patient with son present about DCP/ [place] choices; patient and son chose [place]; Mary with [facility] called for arrangements; Awanda Mink with [facility] called for DME to be delivered to the room; [facility] RN,BSN,MHA [phone]

## 2013-07-12 NOTE — Progress Notes (Signed)
Addendum  Patient seen and examined, chart and data base reviewed.  I agree with the above assessment and plan.  For full details please see Mrs. Imogene Burn PA note.  Acute CVA and hypertension. Did very well with PT/OT likely to be discharged in a.m.    Birdie Hopes, MD [facility] Pager: [phone] 07/12/2013, 6:22 PM

## 2013-07-12 NOTE — Progress Notes (Signed)
Echocardiogram 2D Echocardiogram has been performed.  07/12/2013 11:49 AM Maudry Mayhew, RVT, RDCS, RDMS

## 2013-07-12 NOTE — Progress Notes (Signed)
PROGRESS NOTE  Brett Mora [id number] DOB: [birth year] DOA: 07/11/2013 PCP: Salena Saner., MD  History of Present Illness:This is a 80 y.o. year old male with significant past medical history of HTN, HLD-otherwise healthy presenting with CVA. Pt states that he was at his PCPs office today, when he noticed focal LUE weakness that persisted. Sxs started around 3pm today. Pt's cane fell on the ground and he was unable to pick it up. Had some mild LUE numbness. Denies any hemiparesis, confusion or gait instability. Denies any prior hx/o neurovascular or cardiac issues in the past. Pt brought to the ER. Code stroke called. Initial head CT negative for any acute intracranial abnormalities. NIH stroke score of 2 intitially. Sxs noted to be progressively improving during neuro eval. Working dx of likely small subcortical R cerebral infarction. Pt started on ASA. Has been noncompliant with this in the past.  Assessment/Plan:  Acute Right frontal lobe CVA Residual gait abnormality and significant weakness of his left hand Carotid doppler completed.  Results below 2D echo completed.  Results are pending. PT recommends Home Health PT with Rolling walker - ordered. Aspirin 325 mg daily. Family feels he is not a candidate for the Socrates Trial.  Hyperlipidemia Continue Simvastatin  HTN Stable.  Moderate control. Continue amlodipine, Diovan, and HCTZ  Pulmonary Nodule Question of a 7 mm right pulmonary nodule on CXR. Outpatient follow up.      DVT Prophylaxis:  lovenox  Code Status: full Family Communication:  Disposition Plan: to home when Echo results are ready.   Consultants:  Neurology  Procedures: Carotid Doppler Findings 1-39% internal carotid artery stenosis bilaterally. Vertebral arteries patent with antegrade flow.   Antibiotics: Anti-infectives   None        HPI/Subjective: No complaints.  Objective: Filed Vitals:   07/12/13 0403 07/12/13 0557 07/12/13  0814 07/12/13 1017  BP: 152/63 150/67 158/103 159/75  Pulse: 58 56 80 60  Temp: 97.9 F (36.6 C) 98 F (36.7 C) 98.6 F (37 C) 98.2 F (36.8 C)  TempSrc: Oral Oral Oral Oral  Resp: 18 18 20 20   Height:      Weight:      SpO2: 96% 96% 98% 97%    Intake/Output Summary (Last 24 hours) at 07/12/13 1341 Last data filed at 07/12/13 0900  Gross per 24 hour  Intake    620 ml  Output   1500 ml  Net   -880 ml   Filed Weights   07/11/13 2030  Weight: 100.5 kg (221 lb 9 oz)    Exam: General: Well developed, well nourished, NAD, appears stated age,  HEENT:  PERR, EOMI, Anicteic Sclera, MMM. No pharyngeal erythema or exudates  Neck: Supple, no JVD, no masses  Cardiovascular: RRR, S1 S2 auscultated, no rubs, murmurs or gallops.   Respiratory: Clear to auscultation bilaterally with equal chest rise  Abdomen: Soft, nontender, nondistended, + bowel sounds  Extremities: warm dry without cyanosis clubbing or edema. Neuro: AAOx3, cranial nerves grossly intact. Minimal strength in left hand.   Skin: Without rashes exudates or nodules.   Psych: Normal affect and demeanor with intact judgement and insight       Data Reviewed: Basic Metabolic Panel:  Recent Labs Lab 07/11/13 1653 07/11/13 1918  NA 134*  --   K 4.4  --   CL 95*  --   CO2 24  --   GLUCOSE 104*  --   BUN 24*  --   CREATININE 1.15 1.10  CALCIUM  10.1  --    Liver Function Tests:  Recent Labs Lab 07/11/13 1653  AST 31  ALT 10  ALKPHOS 46  BILITOT 0.5  PROT 8.5*  ALBUMIN 4.1   CBC:  Recent Labs Lab 07/11/13 1653 07/11/13 1918  WBC 5.8 6.0  NEUTROABS 3.3  --   HGB 13.5 13.4  HCT 39.8 39.5  MCV 83.4 83.5  PLT 215 189   CBG:  Recent Labs Lab 07/11/13 1738  GLUCAP 104*    No results found for this or any previous visit (from the past 240 hour(s)).   Studies: Dg Chest 2 View  07/11/2013   CLINICAL DATA:  Stroke.  Left upper extremity weakness.  EXAM: CHEST  2 VIEW  COMPARISON:  None  available.  FINDINGS: Marked cardiopericardial enlargement. No edema or consolidation. No effusion or pneumothorax. Gas filled lower mediastinal mass consistent with hiatal hernia. Question 7 mm pulmonary nodule in the right mid lung.  A lower thoracic body superior endplate is concave (likely T12) presumably a remote fracture given the history.  IMPRESSION: 1. Cardiomegaly without failure. 2. Hiatal hernia. 3. Possible 7 mm nodule in the right lung.   Electronically Signed   By: Jorje Guild M.D.   On: 07/11/2013 22:27   Ct Head (brain) Wo Contrast  07/11/2013   CLINICAL DATA:  Right-sided weakness.  Code stroke.  EXAM: CT HEAD WITHOUT CONTRAST  TECHNIQUE: Contiguous axial images were obtained from the base of the skull through the vertex without intravenous contrast.  COMPARISON:  None.  FINDINGS: There is no evidence of acute cortical infarct or intracranial hemorrhage. There is mild to moderate cerebral atrophy. Confluent periventricular white-matter hypodensities are nonspecific but compatible with moderate chronic small vessel ischemic disease. Punctate focal hypodensity in the right thalamus may represent an old lacunar infarct. There is no evidence of mass, midline shift, or extra-axial fluid collection. Visualized paranasal sinuses and mastoid air cells are clear. Mild bilateral carotid siphon calcification is noted.  IMPRESSION: 1. No evidence of acute cortical infarct or intracranial hemorrhage. 2. Moderate chronic small vessel ischemic disease and likely remote right thalamic lacunar infarct. These results were called by telephone at the time of interpretation on 07/11/2013 at 5:08 PM to Dr. Pryor Curia , who verbally acknowledged these results.   Electronically Signed   By: Logan Bores   On: 07/11/2013 17:09   Mr Brain Wo Contrast  07/11/2013   CLINICAL DATA:  Left upper extremity weakness.  Evaluate for stroke.  EXAM: MRI HEAD WITHOUT CONTRAST  MRA HEAD WITHOUT CONTRAST  TECHNIQUE: Multiplanar,  multiecho pulse sequences of the brain and surrounding structures were obtained without intravenous contrast. Angiographic images of the head were obtained using MRA technique without contrast.  COMPARISON:  None.  FINDINGS: MRI HEAD FINDINGS  Age appropriate cerebral volume loss and atrophy is present. Scattered and confluent T2/FLAIR hyperintensity within the periventricular and deep white matter is most compatible with moderate chronic microvascular ischemic disease. No mass lesion, midline shift, or extra-axial fluid collection. Ventricles are normal in size without evidence of hydrocephalus.  Several scattered foci of abnormal restricted diffusion are seen involving the cortical gray matter and subcortical white matter of the right frontal lobe, just anterior to the central sulcus in the region of the precentral gyrus (series 4, image 21- 25). Corresponding signal dropout seen on ADC map. Finding is compatible with acute ischemic infarct. No other acute infarct identified. No evidence of intracranial hemorrhage. Remote lacunar infarcts involving the left basal ganglia  and right thalamus are noted.  The cervicomedullary junction is normal. Pituitary gland is within normal limits. Pituitary stalk is midline. The globes and optic nerves demonstrate a normal appearance with normal signal intensity. The  The bone marrow signal intensity is normal. Calvarium is intact. Visualized upper cervical spine is within normal limits.  Scalp soft tissues are unremarkable.  Paranasal sinuses are clear.  No mastoid effusion.  MRA HEAD FINDINGS  The visualized portions of the distal cervical internal carotid arteries are widely patent with antegrade flow. The petrous, cavernous, and supra clinoid segments are symmetric in caliber bilaterally. A1 segments, anterior communicating artery, and anterior cerebral arteries are widely patent. Middle cerebral arteries are widely patent with antegrade flow without high-grade flow-limiting  stenosis or proximal branch occlusion. Mild multi focal irregularity seen within the M1 segments bilaterally, right greater than left, likely related to underlying atheromatous disease. Distal MCA branches are symmetric bilaterally. No intracranial aneurysm within the anterior circulation.  The vertebral arteries are widely patent with antegrade flow. The right vertebral artery is dominant. The posterior inferior cerebral arteries are normal. Vertebrobasilar junction and basilar artery are widely patent with antegrade flow without evidence of basilar tip stenosis or aneurysm. Posterior cerebral arteries are normal bilaterally. The superior cerebellar arteries arteries are widely patent bilaterally. Anterior inferior cerebellar arteries are not well visualized. No intracranial aneurysm within the posterior circulation.  IMPRESSION: MRI BRAIN:  1. Acute ischemic infarcts involving the cortical gray matter and subcortical white matter of the right frontal lobe as above. No evidence of hemorrhagic conversion or significant mass effect. 2. Moderate age-related atrophy and chronic microvascular ischemic disease. 3. Subcentimeter remote lacunar infarcts within the left basal ganglia and right thalamus. MRA HEAD:  1. Normal MRA of the brain without evidence of high-grade flow-limiting stenosis, proximal branch occlusion, or other acute vascular abnormality. 2. No intracranial aneurysm.   Electronically Signed   By: Rise Mu M.D.   On: 07/11/2013 22:12   Mr Maxine Glenn Head/brain Wo Cm  07/11/2013   CLINICAL DATA:  Left upper extremity weakness.  Evaluate for stroke.  EXAM: MRI HEAD WITHOUT CONTRAST  MRA HEAD WITHOUT CONTRAST  TECHNIQUE: Multiplanar, multiecho pulse sequences of the brain and surrounding structures were obtained without intravenous contrast. Angiographic images of the head were obtained using MRA technique without contrast.  COMPARISON:  None.  FINDINGS: MRI HEAD FINDINGS  Age appropriate cerebral  volume loss and atrophy is present. Scattered and confluent T2/FLAIR hyperintensity within the periventricular and deep white matter is most compatible with moderate chronic microvascular ischemic disease. No mass lesion, midline shift, or extra-axial fluid collection. Ventricles are normal in size without evidence of hydrocephalus.  Several scattered foci of abnormal restricted diffusion are seen involving the cortical gray matter and subcortical white matter of the right frontal lobe, just anterior to the central sulcus in the region of the precentral gyrus (series 4, image 21- 25). Corresponding signal dropout seen on ADC map. Finding is compatible with acute ischemic infarct. No other acute infarct identified. No evidence of intracranial hemorrhage. Remote lacunar infarcts involving the left basal ganglia and right thalamus are noted.  The cervicomedullary junction is normal. Pituitary gland is within normal limits. Pituitary stalk is midline. The globes and optic nerves demonstrate a normal appearance with normal signal intensity. The  The bone marrow signal intensity is normal. Calvarium is intact. Visualized upper cervical spine is within normal limits.  Scalp soft tissues are unremarkable.  Paranasal sinuses are clear.  No mastoid effusion.  MRA HEAD FINDINGS  The visualized portions of the distal cervical internal carotid arteries are widely patent with antegrade flow. The petrous, cavernous, and supra clinoid segments are symmetric in caliber bilaterally. A1 segments, anterior communicating artery, and anterior cerebral arteries are widely patent. Middle cerebral arteries are widely patent with antegrade flow without high-grade flow-limiting stenosis or proximal branch occlusion. Mild multi focal irregularity seen within the M1 segments bilaterally, right greater than left, likely related to underlying atheromatous disease. Distal MCA branches are symmetric bilaterally. No intracranial aneurysm within the  anterior circulation.  The vertebral arteries are widely patent with antegrade flow. The right vertebral artery is dominant. The posterior inferior cerebral arteries are normal. Vertebrobasilar junction and basilar artery are widely patent with antegrade flow without evidence of basilar tip stenosis or aneurysm. Posterior cerebral arteries are normal bilaterally. The superior cerebellar arteries arteries are widely patent bilaterally. Anterior inferior cerebellar arteries are not well visualized. No intracranial aneurysm within the posterior circulation.  IMPRESSION: MRI BRAIN:  1. Acute ischemic infarcts involving the cortical gray matter and subcortical white matter of the right frontal lobe as above. No evidence of hemorrhagic conversion or significant mass effect. 2. Moderate age-related atrophy and chronic microvascular ischemic disease. 3. Subcentimeter remote lacunar infarcts within the left basal ganglia and right thalamus. MRA HEAD:  1. Normal MRA of the brain without evidence of high-grade flow-limiting stenosis, proximal branch occlusion, or other acute vascular abnormality. 2. No intracranial aneurysm.   Electronically Signed   By: Jeannine Boga M.D.   On: 07/11/2013 22:12    Scheduled Meds: . amLODipine  5 mg Oral Daily  . aspirin  300 mg Rectal Daily   Or  . aspirin  325 mg Oral Daily  . calcium-vitamin D  1 tablet Oral BID  . enoxaparin (LOVENOX) injection  30 mg Subcutaneous Q24H  . hydrochlorothiazide  25 mg Oral Daily  . irbesartan  150 mg Oral Daily  . pantoprazole  40 mg Oral Daily  . simvastatin  20 mg Oral Daily   Continuous Infusions:   Active Problems:   CVA (cerebral infarction)   Hypertension    Karen Kitchens  [facility] Pager [phone]. If 7PM-7AM, please contact night-coverage at [email or web address], password [email or web address] 07/12/2013, 1:41 PM  LOS: 1 day

## 2013-07-13 DIAGNOSIS — E782 Mixed hyperlipidemia: Secondary | ICD-10-CM | POA: Diagnosis present

## 2013-07-13 DIAGNOSIS — E785 Hyperlipidemia, unspecified: Secondary | ICD-10-CM

## 2013-07-13 DIAGNOSIS — R911 Solitary pulmonary nodule: Secondary | ICD-10-CM | POA: Diagnosis present

## 2013-07-13 MED ORDER — ASPIRIN 325 MG PO TABS: 325.0000 mg | ORAL_TABLET | Freq: Every day | ORAL | Status: DC

## 2013-07-13 NOTE — Progress Notes (Signed)
Pt discharged home with family in stable condition. Daughter and grandchildren at the bedside at time of D/C Education provided and questions answered. Home health services set ut with [facility].

## 2013-07-13 NOTE — Discharge Summary (Signed)
Physician Discharge Summary  Brett Mora GHW:[id number] DOB: June 01, 1919 DOA: 07/11/2013  PCP: Alva Garnet., MD  Admit date: 07/11/2013 Discharge date: 07/13/2013  Time spent: 40 minutes  Recommendations for Outpatient Follow-up:  1. Followup with primary care physician within 2 weeks. 2. Check BMP in 2 weeks.  Discharge Diagnoses:  Active Problems:   CVA (cerebral infarction)   Hypertension   Hyperlipidemia   Solitary pulmonary nodule   Discharge Condition: Stable  Diet recommendation: Heart healthy diet  Filed Weights   07/11/13 2030  Weight: 100.5 kg (221 lb 9 oz)    History of present illness:  History of Present Illness:This is a 80 y.o. year old male with significant past medical history of HTN, HLD-otherwise healthy presenting with CVA. Pt states that he was at his PCPs office today, when he noticed focal LUE weakness that persisted. Sxs started around 3pm today. Pt's cane fell on the ground and he was unable to pick it up. Had some mild LUE numbness. Denies any hemiparesis, confusion or gait instability. Denies any prior hx/o neurovascular or cardiac issues in the past. Pt brought to the ER. Code stroke called. Initial head CT negative for any acute intracranial abnormalities. NIH stroke score of 2 intitially. Sxs noted to be progressively improving during neuro eval. Working dx of likely small subcortical R cerebral infarction. Pt started on ASA. Has been noncompliant with this in the past.   Hospital Course:   Acute Right frontal lobe CVA  Presented with gait abnormality and significant weakness of his left hand  MRI showed acute ischemic subcortical infarcts involving the right frontal lobe (see MRI of below) PT recommends Home Health PT with Rolling walker - ordered.  Neurology recommended aspirin 325 mg daily for secondary prevention  Followup with the stroke service as outpatient.  Hyperlipidemia  Continue Simvastatin   HTN  Stable. Moderate control.   Continue amlodipine, Diovan, and HCTZ.  Pulmonary Nodule  Question of a 7 mm right pulmonary nodule on CXR.  Outpatient follow up.   Procedures:  2-D echocardiogram showed ejection fraction of 55-60%, grade 1 diastolic dysfunction.  Carotid Doppler Findings 1-39% internal carotid artery stenosis bilaterally. Vertebral arteries patent with antegrade flow.   Consultations:  Neurology  Discharge Exam: Filed Vitals:   07/13/13 0612  BP: 138/83  Pulse: 60  Temp: 98 F (36.7 C)  Resp: 18   General: Alert and awake, oriented x3, not in any acute distress. HEENT: anicteric sclera, pupils reactive to light and accommodation, EOMI CVS: S1-S2 clear, no murmur rubs or gallops Chest: clear to auscultation bilaterally, no wheezing, rales or rhonchi Abdomen: soft nontender, nondistended, normal bowel sounds, no organomegaly Extremities: no cyanosis, clubbing or edema noted bilaterally Neuro: Cranial nerves II-XII intact, no focal neurological deficits  Discharge Instructions  Discharge Orders   Future Appointments Provider Department Dept Phone   07/30/2013 1:30 PM Alvan Dame, DPM [facility] [phone]   Future Orders Complete By Expires   Diet - low sodium heart healthy  As directed    Increase activity slowly  As directed        Medication List         amLODipine 5 MG tablet  Commonly known as:  NORVASC  Take 5 mg by mouth daily.     aspirin 325 MG tablet  Take 1 tablet (325 mg total) by mouth daily.     CALCIUM 600+D 600-400 MG-UNIT per tablet  Generic drug:  Calcium Carbonate-Vitamin D  Take 1 tablet  by mouth 3 (three) times daily with meals.     hydrochlorothiazide 25 MG tablet  Commonly known as:  HYDRODIURIL  Take 25 mg by mouth daily.     HYDROcodone-acetaminophen 5-325 MG per tablet  Commonly known as:  NORCO/VICODIN  Take 1 tablet by mouth every 6 (six) hours as needed (pain).     levocetirizine 5 MG tablet  Commonly known  as:  XYZAL  Take 5 mg by mouth daily.     OVER THE COUNTER MEDICATION  Take 1 tablet by mouth daily. Iron with stool softener 150 mg-100 mg extended release     pantoprazole 40 MG tablet  Commonly known as:  PROTONIX  Take 40 mg by mouth daily.     simvastatin 20 MG tablet  Commonly known as:  ZOCOR  Take 20 mg by mouth daily.     traMADol 50 MG tablet  Commonly known as:  ULTRAM  Take 50 mg by mouth every 6 (six) hours as needed (pain).     valsartan 160 MG tablet  Commonly known as:  DIOVAN  Take 160 mg by mouth daily.     VITAMIN D PO  Take 1 tablet by mouth daily.       No Known Allergies     Follow-up Information   Follow up with Salena Saner., MD. Schedule an appointment as soon as possible for a visit in 2 weeks. (Rule out right sided lung nodule.)    Specialty:  Internal Medicine   Contact information:   [place] [address] [phone]       Follow up with [facility].   Contact information:   [address] [place] [address] [phone]        The results of significant diagnostics from this hospitalization (including imaging, microbiology, ancillary and laboratory) are listed below for reference.    Significant Diagnostic Studies: Dg Chest 2 View  07/11/2013   CLINICAL DATA:  Stroke.  Left upper extremity weakness.  EXAM: CHEST  2 VIEW  COMPARISON:  None available.  FINDINGS: Marked cardiopericardial enlargement. No edema or consolidation. No effusion or pneumothorax. Gas filled lower mediastinal mass consistent with hiatal hernia. Question 7 mm pulmonary nodule in the right mid lung.  A lower thoracic body superior endplate is concave (likely T12) presumably a remote fracture given the history.  IMPRESSION: 1. Cardiomegaly without failure. 2. Hiatal hernia. 3. Possible 7 mm nodule in the right lung.   Electronically Signed   By: Jorje Guild M.D.   On: 07/11/2013 22:27   Ct Head (brain)  Wo Contrast  07/11/2013   CLINICAL DATA:  Right-sided weakness.  Code stroke.  EXAM: CT HEAD WITHOUT CONTRAST  TECHNIQUE: Contiguous axial images were obtained from the base of the skull through the vertex without intravenous contrast.  COMPARISON:  None.  FINDINGS: There is no evidence of acute cortical infarct or intracranial hemorrhage. There is mild to moderate cerebral atrophy. Confluent periventricular white-matter hypodensities are nonspecific but compatible with moderate chronic small vessel ischemic disease. Punctate focal hypodensity in the right thalamus may represent an old lacunar infarct. There is no evidence of mass, midline shift, or extra-axial fluid collection. Visualized paranasal sinuses and mastoid air cells are clear. Mild bilateral carotid siphon calcification is noted.  IMPRESSION: 1. No evidence of acute cortical infarct or intracranial hemorrhage. 2. Moderate chronic small vessel ischemic disease and likely remote right thalamic lacunar infarct. These results were called by  telephone at the time of interpretation on 07/11/2013 at 5:08 PM to Dr. Pryor Curia , who verbally acknowledged these results.   Electronically Signed   By: Logan Bores   On: 07/11/2013 17:09   Mr Brain Wo Contrast  07/11/2013   CLINICAL DATA:  Left upper extremity weakness.  Evaluate for stroke.  EXAM: MRI HEAD WITHOUT CONTRAST  MRA HEAD WITHOUT CONTRAST  TECHNIQUE: Multiplanar, multiecho pulse sequences of the brain and surrounding structures were obtained without intravenous contrast. Angiographic images of the head were obtained using MRA technique without contrast.  COMPARISON:  None.  FINDINGS: MRI HEAD FINDINGS  Age appropriate cerebral volume loss and atrophy is present. Scattered and confluent T2/FLAIR hyperintensity within the periventricular and deep white matter is most compatible with moderate chronic microvascular ischemic disease. No mass lesion, midline shift, or extra-axial fluid collection. Ventricles  are normal in size without evidence of hydrocephalus.  Several scattered foci of abnormal restricted diffusion are seen involving the cortical gray matter and subcortical white matter of the right frontal lobe, just anterior to the central sulcus in the region of the precentral gyrus (series 4, image 21- 25). Corresponding signal dropout seen on ADC map. Finding is compatible with acute ischemic infarct. No other acute infarct identified. No evidence of intracranial hemorrhage. Remote lacunar infarcts involving the left basal ganglia and right thalamus are noted.  The cervicomedullary junction is normal. Pituitary gland is within normal limits. Pituitary stalk is midline. The globes and optic nerves demonstrate a normal appearance with normal signal intensity. The  The bone marrow signal intensity is normal. Calvarium is intact. Visualized upper cervical spine is within normal limits.  Scalp soft tissues are unremarkable.  Paranasal sinuses are clear.  No mastoid effusion.  MRA HEAD FINDINGS  The visualized portions of the distal cervical internal carotid arteries are widely patent with antegrade flow. The petrous, cavernous, and supra clinoid segments are symmetric in caliber bilaterally. A1 segments, anterior communicating artery, and anterior cerebral arteries are widely patent. Middle cerebral arteries are widely patent with antegrade flow without high-grade flow-limiting stenosis or proximal branch occlusion. Mild multi focal irregularity seen within the M1 segments bilaterally, right greater than left, likely related to underlying atheromatous disease. Distal MCA branches are symmetric bilaterally. No intracranial aneurysm within the anterior circulation.  The vertebral arteries are widely patent with antegrade flow. The right vertebral artery is dominant. The posterior inferior cerebral arteries are normal. Vertebrobasilar junction and basilar artery are widely patent with antegrade flow without evidence of  basilar tip stenosis or aneurysm. Posterior cerebral arteries are normal bilaterally. The superior cerebellar arteries arteries are widely patent bilaterally. Anterior inferior cerebellar arteries are not well visualized. No intracranial aneurysm within the posterior circulation.  IMPRESSION: MRI BRAIN:  1. Acute ischemic infarcts involving the cortical gray matter and subcortical white matter of the right frontal lobe as above. No evidence of hemorrhagic conversion or significant mass effect. 2. Moderate age-related atrophy and chronic microvascular ischemic disease. 3. Subcentimeter remote lacunar infarcts within the left basal ganglia and right thalamus. MRA HEAD:  1. Normal MRA of the brain without evidence of high-grade flow-limiting stenosis, proximal branch occlusion, or other acute vascular abnormality. 2. No intracranial aneurysm.   Electronically Signed   By: Jeannine Boga M.D.   On: 07/11/2013 22:12   Mr Jodene Nam Head/brain Wo Cm  07/11/2013   CLINICAL DATA:  Left upper extremity weakness.  Evaluate for stroke.  EXAM: MRI HEAD WITHOUT CONTRAST  MRA HEAD WITHOUT CONTRAST  TECHNIQUE: Multiplanar, multiecho pulse sequences of the brain and surrounding structures were obtained without intravenous contrast. Angiographic images of the head were obtained using MRA technique without contrast.  COMPARISON:  None.  FINDINGS: MRI HEAD FINDINGS  Age appropriate cerebral volume loss and atrophy is present. Scattered and confluent T2/FLAIR hyperintensity within the periventricular and deep white matter is most compatible with moderate chronic microvascular ischemic disease. No mass lesion, midline shift, or extra-axial fluid collection. Ventricles are normal in size without evidence of hydrocephalus.  Several scattered foci of abnormal restricted diffusion are seen involving the cortical gray matter and subcortical white matter of the right frontal lobe, just anterior to the central sulcus in the region of the  precentral gyrus (series 4, image 21- 25). Corresponding signal dropout seen on ADC map. Finding is compatible with acute ischemic infarct. No other acute infarct identified. No evidence of intracranial hemorrhage. Remote lacunar infarcts involving the left basal ganglia and right thalamus are noted.  The cervicomedullary junction is normal. Pituitary gland is within normal limits. Pituitary stalk is midline. The globes and optic nerves demonstrate a normal appearance with normal signal intensity. The  The bone marrow signal intensity is normal. Calvarium is intact. Visualized upper cervical spine is within normal limits.  Scalp soft tissues are unremarkable.  Paranasal sinuses are clear.  No mastoid effusion.  MRA HEAD FINDINGS  The visualized portions of the distal cervical internal carotid arteries are widely patent with antegrade flow. The petrous, cavernous, and supra clinoid segments are symmetric in caliber bilaterally. A1 segments, anterior communicating artery, and anterior cerebral arteries are widely patent. Middle cerebral arteries are widely patent with antegrade flow without high-grade flow-limiting stenosis or proximal branch occlusion. Mild multi focal irregularity seen within the M1 segments bilaterally, right greater than left, likely related to underlying atheromatous disease. Distal MCA branches are symmetric bilaterally. No intracranial aneurysm within the anterior circulation.  The vertebral arteries are widely patent with antegrade flow. The right vertebral artery is dominant. The posterior inferior cerebral arteries are normal. Vertebrobasilar junction and basilar artery are widely patent with antegrade flow without evidence of basilar tip stenosis or aneurysm. Posterior cerebral arteries are normal bilaterally. The superior cerebellar arteries arteries are widely patent bilaterally. Anterior inferior cerebellar arteries are not well visualized. No intracranial aneurysm within the posterior  circulation.  IMPRESSION: MRI BRAIN:  1. Acute ischemic infarcts involving the cortical gray matter and subcortical white matter of the right frontal lobe as above. No evidence of hemorrhagic conversion or significant mass effect. 2. Moderate age-related atrophy and chronic microvascular ischemic disease. 3. Subcentimeter remote lacunar infarcts within the left basal ganglia and right thalamus. MRA HEAD:  1. Normal MRA of the brain without evidence of high-grade flow-limiting stenosis, proximal branch occlusion, or other acute vascular abnormality. 2. No intracranial aneurysm.   Electronically Signed   By: Jeannine Boga M.D.   On: 07/11/2013 22:12    Microbiology: No results found for this or any previous visit (from the past 240 hour(s)).   Labs: Basic Metabolic Panel:  Recent Labs Lab 07/11/13 1653 07/11/13 1918  NA 134*  --   K 4.4  --   CL 95*  --   CO2 24  --   GLUCOSE 104*  --   BUN 24*  --   CREATININE 1.15 1.10  CALCIUM 10.1  --    Liver Function Tests:  Recent Labs Lab 07/11/13 1653  AST 31  ALT 10  ALKPHOS 46  BILITOT 0.5  PROT  8.5*  ALBUMIN 4.1   No results found for this basename: LIPASE, AMYLASE,  in the last 168 hours No results found for this basename: AMMONIA,  in the last 168 hours CBC:  Recent Labs Lab 07/11/13 1653 07/11/13 1918  WBC 5.8 6.0  NEUTROABS 3.3  --   HGB 13.5 13.4  HCT 39.8 39.5  MCV 83.4 83.5  PLT 215 189   Cardiac Enzymes: No results found for this basename: CKTOTAL, CKMB, CKMBINDEX, TROPONINI,  in the last 168 hours BNP: BNP (last 3 results) No results found for this basename: PROBNP,  in the last 8760 hours CBG:  Recent Labs Lab 07/11/13 1738  GLUCAP 104*       Signed:  Elinora Weigand A  [facility] 07/13/2013, 9:51 AM

## 2013-07-14 NOTE — Progress Notes (Signed)
   CARE MANAGEMENT NOTE 07/14/2013  Patient:  ZAKARIYAH, FREIMARK   Account Number:  [id number]  Date Initiated:  07/12/2013  Documentation initiated by:  Olga Coaster  Subjective/Objective Assessment:   ADMITTED FOR STROKE WORKUP     Action/Plan:   CM FOLLOWING FOR DCP   Anticipated DC Date:  07/17/2013   Anticipated DC Plan:  [place]  CM consult      Choice offered to / List presented to:  C-4 Adult Children   DME arranged  [facility]      DME agency  [place] arranged  [place].   Status of service:  Completed, signed off Medicare Important Message given?  NA - LOS <3 / Initial given by admissions (If response is "NO", the following Medicare IM given date fields will be blank) Date Medicare IM given:   Date Additional Medicare IM given:    Discharge Disposition:  [place]  Per UR Regulation:  Reviewed for med. necessity/level of care/duration of stay  If discussed at [place] of Stay Meetings, dates discussed:    Comments:  07/14/13 09:30 CM notified [facility] of pt discharge.  No other CM needs were communicated.  Mariane Masters, BSN, [place].  07/12/2013- Talked to patient with son present about DCP/ Echo choices; patient and son chose [place]; Mary with [facility] called for arrangements; Awanda Mink with [facility] called for DME to be delivered to the room; B [facility] RN,BSN,MHA [phone]

## 2013-07-17 DIAGNOSIS — I10 Essential (primary) hypertension: Secondary | ICD-10-CM | POA: Diagnosis not present

## 2013-07-17 DIAGNOSIS — IMO0001 Reserved for inherently not codable concepts without codable children: Secondary | ICD-10-CM | POA: Diagnosis not present

## 2013-07-17 DIAGNOSIS — I69939 Monoplegia of upper limb following unspecified cerebrovascular disease affecting unspecified side: Secondary | ICD-10-CM | POA: Diagnosis not present

## 2013-07-19 DIAGNOSIS — I10 Essential (primary) hypertension: Secondary | ICD-10-CM | POA: Diagnosis not present

## 2013-07-19 DIAGNOSIS — I69939 Monoplegia of upper limb following unspecified cerebrovascular disease affecting unspecified side: Secondary | ICD-10-CM | POA: Diagnosis not present

## 2013-07-19 DIAGNOSIS — IMO0001 Reserved for inherently not codable concepts without codable children: Secondary | ICD-10-CM | POA: Diagnosis not present

## 2013-07-22 DIAGNOSIS — I10 Essential (primary) hypertension: Secondary | ICD-10-CM | POA: Diagnosis not present

## 2013-07-22 DIAGNOSIS — Z79899 Other long term (current) drug therapy: Secondary | ICD-10-CM | POA: Diagnosis not present

## 2013-07-22 DIAGNOSIS — Z09 Encounter for follow-up examination after completed treatment for conditions other than malignant neoplasm: Secondary | ICD-10-CM | POA: Diagnosis not present

## 2013-07-22 DIAGNOSIS — R911 Solitary pulmonary nodule: Secondary | ICD-10-CM | POA: Diagnosis not present

## 2013-07-22 DIAGNOSIS — I6350 Cerebral infarction due to unspecified occlusion or stenosis of unspecified cerebral artery: Secondary | ICD-10-CM | POA: Diagnosis not present

## 2013-07-23 DIAGNOSIS — I10 Essential (primary) hypertension: Secondary | ICD-10-CM | POA: Diagnosis not present

## 2013-07-23 DIAGNOSIS — I69939 Monoplegia of upper limb following unspecified cerebrovascular disease affecting unspecified side: Secondary | ICD-10-CM | POA: Diagnosis not present

## 2013-07-23 DIAGNOSIS — IMO0001 Reserved for inherently not codable concepts without codable children: Secondary | ICD-10-CM | POA: Diagnosis not present

## 2013-07-25 DIAGNOSIS — I69939 Monoplegia of upper limb following unspecified cerebrovascular disease affecting unspecified side: Secondary | ICD-10-CM | POA: Diagnosis not present

## 2013-07-25 DIAGNOSIS — IMO0001 Reserved for inherently not codable concepts without codable children: Secondary | ICD-10-CM | POA: Diagnosis not present

## 2013-07-25 DIAGNOSIS — I10 Essential (primary) hypertension: Secondary | ICD-10-CM | POA: Diagnosis not present

## 2013-07-30 ENCOUNTER — Ambulatory Visit: Payer: BC Managed Care – PPO

## 2013-07-30 DIAGNOSIS — I10 Essential (primary) hypertension: Secondary | ICD-10-CM | POA: Diagnosis not present

## 2013-07-30 DIAGNOSIS — IMO0001 Reserved for inherently not codable concepts without codable children: Secondary | ICD-10-CM | POA: Diagnosis not present

## 2013-07-30 DIAGNOSIS — I69939 Monoplegia of upper limb following unspecified cerebrovascular disease affecting unspecified side: Secondary | ICD-10-CM | POA: Diagnosis not present

## 2013-07-31 DIAGNOSIS — I69939 Monoplegia of upper limb following unspecified cerebrovascular disease affecting unspecified side: Secondary | ICD-10-CM | POA: Diagnosis not present

## 2013-07-31 DIAGNOSIS — IMO0001 Reserved for inherently not codable concepts without codable children: Secondary | ICD-10-CM | POA: Diagnosis not present

## 2013-07-31 DIAGNOSIS — I10 Essential (primary) hypertension: Secondary | ICD-10-CM | POA: Diagnosis not present

## 2013-08-01 DIAGNOSIS — I69939 Monoplegia of upper limb following unspecified cerebrovascular disease affecting unspecified side: Secondary | ICD-10-CM | POA: Diagnosis not present

## 2013-08-01 DIAGNOSIS — IMO0001 Reserved for inherently not codable concepts without codable children: Secondary | ICD-10-CM | POA: Diagnosis not present

## 2013-08-01 DIAGNOSIS — I10 Essential (primary) hypertension: Secondary | ICD-10-CM | POA: Diagnosis not present

## 2013-08-05 DIAGNOSIS — I69939 Monoplegia of upper limb following unspecified cerebrovascular disease affecting unspecified side: Secondary | ICD-10-CM | POA: Diagnosis not present

## 2013-08-05 DIAGNOSIS — I10 Essential (primary) hypertension: Secondary | ICD-10-CM | POA: Diagnosis not present

## 2013-08-05 DIAGNOSIS — IMO0001 Reserved for inherently not codable concepts without codable children: Secondary | ICD-10-CM | POA: Diagnosis not present

## 2013-08-06 DIAGNOSIS — I69939 Monoplegia of upper limb following unspecified cerebrovascular disease affecting unspecified side: Secondary | ICD-10-CM | POA: Diagnosis not present

## 2013-08-06 DIAGNOSIS — IMO0001 Reserved for inherently not codable concepts without codable children: Secondary | ICD-10-CM | POA: Diagnosis not present

## 2013-08-06 DIAGNOSIS — I10 Essential (primary) hypertension: Secondary | ICD-10-CM | POA: Diagnosis not present

## 2013-08-07 DIAGNOSIS — I10 Essential (primary) hypertension: Secondary | ICD-10-CM | POA: Diagnosis not present

## 2013-08-07 DIAGNOSIS — I69939 Monoplegia of upper limb following unspecified cerebrovascular disease affecting unspecified side: Secondary | ICD-10-CM | POA: Diagnosis not present

## 2013-08-07 DIAGNOSIS — IMO0001 Reserved for inherently not codable concepts without codable children: Secondary | ICD-10-CM | POA: Diagnosis not present

## 2013-08-08 DIAGNOSIS — I10 Essential (primary) hypertension: Secondary | ICD-10-CM | POA: Diagnosis not present

## 2013-08-08 DIAGNOSIS — I69939 Monoplegia of upper limb following unspecified cerebrovascular disease affecting unspecified side: Secondary | ICD-10-CM | POA: Diagnosis not present

## 2013-08-08 DIAGNOSIS — IMO0001 Reserved for inherently not codable concepts without codable children: Secondary | ICD-10-CM | POA: Diagnosis not present

## 2013-08-12 DIAGNOSIS — I69939 Monoplegia of upper limb following unspecified cerebrovascular disease affecting unspecified side: Secondary | ICD-10-CM | POA: Diagnosis not present

## 2013-08-12 DIAGNOSIS — IMO0001 Reserved for inherently not codable concepts without codable children: Secondary | ICD-10-CM | POA: Diagnosis not present

## 2013-08-12 DIAGNOSIS — I10 Essential (primary) hypertension: Secondary | ICD-10-CM | POA: Diagnosis not present

## 2013-08-13 ENCOUNTER — Other Ambulatory Visit (HOSPITAL_COMMUNITY): Payer: Self-pay | Admitting: Urology

## 2013-08-13 DIAGNOSIS — I69939 Monoplegia of upper limb following unspecified cerebrovascular disease affecting unspecified side: Secondary | ICD-10-CM | POA: Diagnosis not present

## 2013-08-13 DIAGNOSIS — IMO0001 Reserved for inherently not codable concepts without codable children: Secondary | ICD-10-CM | POA: Diagnosis not present

## 2013-08-13 DIAGNOSIS — C61 Malignant neoplasm of prostate: Secondary | ICD-10-CM

## 2013-08-13 DIAGNOSIS — I10 Essential (primary) hypertension: Secondary | ICD-10-CM | POA: Diagnosis not present

## 2013-08-14 DIAGNOSIS — IMO0001 Reserved for inherently not codable concepts without codable children: Secondary | ICD-10-CM | POA: Diagnosis not present

## 2013-08-14 DIAGNOSIS — I10 Essential (primary) hypertension: Secondary | ICD-10-CM | POA: Diagnosis not present

## 2013-08-14 DIAGNOSIS — I69939 Monoplegia of upper limb following unspecified cerebrovascular disease affecting unspecified side: Secondary | ICD-10-CM | POA: Diagnosis not present

## 2013-08-15 DIAGNOSIS — I10 Essential (primary) hypertension: Secondary | ICD-10-CM | POA: Diagnosis not present

## 2013-08-15 DIAGNOSIS — IMO0001 Reserved for inherently not codable concepts without codable children: Secondary | ICD-10-CM | POA: Diagnosis not present

## 2013-08-15 DIAGNOSIS — I69939 Monoplegia of upper limb following unspecified cerebrovascular disease affecting unspecified side: Secondary | ICD-10-CM | POA: Diagnosis not present

## 2013-08-19 ENCOUNTER — Encounter (HOSPITAL_COMMUNITY)
Admission: RE | Admit: 2013-08-19 | Discharge: 2013-08-19 | Disposition: A | Discharge status: Home / Self Care | Payer: Medicare Other | Source: Ambulatory Visit | Attending: Urology | Admitting: Urology

## 2013-08-19 DIAGNOSIS — C61 Malignant neoplasm of prostate: Secondary | ICD-10-CM | POA: Diagnosis not present

## 2013-08-19 DIAGNOSIS — I69939 Monoplegia of upper limb following unspecified cerebrovascular disease affecting unspecified side: Secondary | ICD-10-CM | POA: Diagnosis not present

## 2013-08-19 DIAGNOSIS — I10 Essential (primary) hypertension: Secondary | ICD-10-CM | POA: Diagnosis not present

## 2013-08-19 DIAGNOSIS — IMO0001 Reserved for inherently not codable concepts without codable children: Secondary | ICD-10-CM | POA: Diagnosis not present

## 2013-08-19 MED ORDER — TECHNETIUM TC 99M MEDRONATE IV KIT
26.0000 | PACK | Freq: Once | INTRAVENOUS | Status: AC | PRN
  Administered 2013-08-19: 26 via INTRAVENOUS

## 2013-08-20 DIAGNOSIS — IMO0001 Reserved for inherently not codable concepts without codable children: Secondary | ICD-10-CM | POA: Diagnosis not present

## 2013-08-20 DIAGNOSIS — I10 Essential (primary) hypertension: Secondary | ICD-10-CM | POA: Diagnosis not present

## 2013-08-20 DIAGNOSIS — I69939 Monoplegia of upper limb following unspecified cerebrovascular disease affecting unspecified side: Secondary | ICD-10-CM | POA: Diagnosis not present

## 2013-08-21 DIAGNOSIS — IMO0001 Reserved for inherently not codable concepts without codable children: Secondary | ICD-10-CM | POA: Diagnosis not present

## 2013-08-21 DIAGNOSIS — I69939 Monoplegia of upper limb following unspecified cerebrovascular disease affecting unspecified side: Secondary | ICD-10-CM | POA: Diagnosis not present

## 2013-08-21 DIAGNOSIS — I10 Essential (primary) hypertension: Secondary | ICD-10-CM | POA: Diagnosis not present

## 2013-08-22 DIAGNOSIS — I69939 Monoplegia of upper limb following unspecified cerebrovascular disease affecting unspecified side: Secondary | ICD-10-CM | POA: Diagnosis not present

## 2013-08-22 DIAGNOSIS — IMO0001 Reserved for inherently not codable concepts without codable children: Secondary | ICD-10-CM | POA: Diagnosis not present

## 2013-08-22 DIAGNOSIS — I10 Essential (primary) hypertension: Secondary | ICD-10-CM | POA: Diagnosis not present

## 2013-08-26 DIAGNOSIS — I69939 Monoplegia of upper limb following unspecified cerebrovascular disease affecting unspecified side: Secondary | ICD-10-CM | POA: Diagnosis not present

## 2013-08-26 DIAGNOSIS — IMO0001 Reserved for inherently not codable concepts without codable children: Secondary | ICD-10-CM | POA: Diagnosis not present

## 2013-08-26 DIAGNOSIS — I10 Essential (primary) hypertension: Secondary | ICD-10-CM | POA: Diagnosis not present

## 2013-08-27 ENCOUNTER — Ambulatory Visit: Payer: BC Managed Care – PPO

## 2013-08-27 DIAGNOSIS — I69939 Monoplegia of upper limb following unspecified cerebrovascular disease affecting unspecified side: Secondary | ICD-10-CM | POA: Diagnosis not present

## 2013-08-27 DIAGNOSIS — IMO0001 Reserved for inherently not codable concepts without codable children: Secondary | ICD-10-CM | POA: Diagnosis not present

## 2013-08-27 DIAGNOSIS — I10 Essential (primary) hypertension: Secondary | ICD-10-CM | POA: Diagnosis not present

## 2013-08-28 DIAGNOSIS — I10 Essential (primary) hypertension: Secondary | ICD-10-CM | POA: Diagnosis not present

## 2013-08-28 DIAGNOSIS — IMO0001 Reserved for inherently not codable concepts without codable children: Secondary | ICD-10-CM | POA: Diagnosis not present

## 2013-08-28 DIAGNOSIS — I69939 Monoplegia of upper limb following unspecified cerebrovascular disease affecting unspecified side: Secondary | ICD-10-CM | POA: Diagnosis not present

## 2013-08-29 DIAGNOSIS — I10 Essential (primary) hypertension: Secondary | ICD-10-CM | POA: Diagnosis not present

## 2013-08-29 DIAGNOSIS — IMO0001 Reserved for inherently not codable concepts without codable children: Secondary | ICD-10-CM | POA: Diagnosis not present

## 2013-08-29 DIAGNOSIS — I69939 Monoplegia of upper limb following unspecified cerebrovascular disease affecting unspecified side: Secondary | ICD-10-CM | POA: Diagnosis not present

## 2013-09-02 DIAGNOSIS — I10 Essential (primary) hypertension: Secondary | ICD-10-CM | POA: Diagnosis not present

## 2013-09-02 DIAGNOSIS — I69939 Monoplegia of upper limb following unspecified cerebrovascular disease affecting unspecified side: Secondary | ICD-10-CM | POA: Diagnosis not present

## 2013-09-02 DIAGNOSIS — IMO0001 Reserved for inherently not codable concepts without codable children: Secondary | ICD-10-CM | POA: Diagnosis not present

## 2013-09-03 DIAGNOSIS — IMO0001 Reserved for inherently not codable concepts without codable children: Secondary | ICD-10-CM | POA: Diagnosis not present

## 2013-09-03 DIAGNOSIS — I10 Essential (primary) hypertension: Secondary | ICD-10-CM | POA: Diagnosis not present

## 2013-09-03 DIAGNOSIS — I69939 Monoplegia of upper limb following unspecified cerebrovascular disease affecting unspecified side: Secondary | ICD-10-CM | POA: Diagnosis not present

## 2013-09-04 DIAGNOSIS — C61 Malignant neoplasm of prostate: Secondary | ICD-10-CM | POA: Diagnosis not present

## 2013-09-05 DIAGNOSIS — IMO0001 Reserved for inherently not codable concepts without codable children: Secondary | ICD-10-CM | POA: Diagnosis not present

## 2013-09-05 DIAGNOSIS — I69939 Monoplegia of upper limb following unspecified cerebrovascular disease affecting unspecified side: Secondary | ICD-10-CM | POA: Diagnosis not present

## 2013-09-05 DIAGNOSIS — I10 Essential (primary) hypertension: Secondary | ICD-10-CM | POA: Diagnosis not present

## 2013-09-09 DIAGNOSIS — I69939 Monoplegia of upper limb following unspecified cerebrovascular disease affecting unspecified side: Secondary | ICD-10-CM | POA: Diagnosis not present

## 2013-09-09 DIAGNOSIS — IMO0001 Reserved for inherently not codable concepts without codable children: Secondary | ICD-10-CM | POA: Diagnosis not present

## 2013-09-09 DIAGNOSIS — I10 Essential (primary) hypertension: Secondary | ICD-10-CM | POA: Diagnosis not present

## 2013-09-10 DIAGNOSIS — I10 Essential (primary) hypertension: Secondary | ICD-10-CM | POA: Diagnosis not present

## 2013-09-10 DIAGNOSIS — IMO0001 Reserved for inherently not codable concepts without codable children: Secondary | ICD-10-CM | POA: Diagnosis not present

## 2013-09-10 DIAGNOSIS — I69939 Monoplegia of upper limb following unspecified cerebrovascular disease affecting unspecified side: Secondary | ICD-10-CM | POA: Diagnosis not present

## 2013-09-12 DIAGNOSIS — I69939 Monoplegia of upper limb following unspecified cerebrovascular disease affecting unspecified side: Secondary | ICD-10-CM | POA: Diagnosis not present

## 2013-09-12 DIAGNOSIS — I10 Essential (primary) hypertension: Secondary | ICD-10-CM | POA: Diagnosis not present

## 2013-09-12 DIAGNOSIS — IMO0001 Reserved for inherently not codable concepts without codable children: Secondary | ICD-10-CM | POA: Diagnosis not present

## 2013-09-13 DIAGNOSIS — I69939 Monoplegia of upper limb following unspecified cerebrovascular disease affecting unspecified side: Secondary | ICD-10-CM | POA: Diagnosis not present

## 2013-09-13 DIAGNOSIS — I10 Essential (primary) hypertension: Secondary | ICD-10-CM | POA: Diagnosis not present

## 2013-09-13 DIAGNOSIS — IMO0001 Reserved for inherently not codable concepts without codable children: Secondary | ICD-10-CM | POA: Diagnosis not present

## 2013-11-12 ENCOUNTER — Ambulatory Visit: Payer: BC Managed Care – PPO

## 2013-12-06 DIAGNOSIS — M545 Low back pain, unspecified: Secondary | ICD-10-CM | POA: Diagnosis not present

## 2013-12-06 DIAGNOSIS — E785 Hyperlipidemia, unspecified: Secondary | ICD-10-CM | POA: Diagnosis not present

## 2013-12-06 DIAGNOSIS — M159 Polyosteoarthritis, unspecified: Secondary | ICD-10-CM | POA: Diagnosis not present

## 2013-12-06 DIAGNOSIS — I10 Essential (primary) hypertension: Secondary | ICD-10-CM | POA: Diagnosis not present

## 2013-12-06 DIAGNOSIS — D649 Anemia, unspecified: Secondary | ICD-10-CM | POA: Diagnosis not present

## 2013-12-06 DIAGNOSIS — E559 Vitamin D deficiency, unspecified: Secondary | ICD-10-CM | POA: Diagnosis not present

## 2014-01-10 DIAGNOSIS — C61 Malignant neoplasm of prostate: Secondary | ICD-10-CM | POA: Diagnosis not present

## 2014-01-28 ENCOUNTER — Ambulatory Visit (INDEPENDENT_AMBULATORY_CARE_PROVIDER_SITE_OTHER): Payer: Medicare Other

## 2014-01-28 DIAGNOSIS — G629 Polyneuropathy, unspecified: Secondary | ICD-10-CM

## 2014-01-28 DIAGNOSIS — L97509 Non-pressure chronic ulcer of other part of unspecified foot with unspecified severity: Secondary | ICD-10-CM

## 2014-01-28 DIAGNOSIS — M79609 Pain in unspecified limb: Secondary | ICD-10-CM

## 2014-01-28 DIAGNOSIS — I739 Peripheral vascular disease, unspecified: Secondary | ICD-10-CM | POA: Diagnosis not present

## 2014-01-28 DIAGNOSIS — G609 Hereditary and idiopathic neuropathy, unspecified: Secondary | ICD-10-CM

## 2014-01-28 DIAGNOSIS — M79676 Pain in unspecified toe(s): Secondary | ICD-10-CM

## 2014-01-28 DIAGNOSIS — Q828 Other specified congenital malformations of skin: Secondary | ICD-10-CM

## 2014-01-28 DIAGNOSIS — B351 Tinea unguium: Secondary | ICD-10-CM

## 2014-01-28 DIAGNOSIS — L98499 Non-pressure chronic ulcer of skin of other sites with unspecified severity: Secondary | ICD-10-CM

## 2014-01-28 NOTE — Progress Notes (Signed)
   Subjective:    Patient ID: Brett Mora, male    DOB: Jan 21, 1920, 80 y.o.   MRN: [id number]  HPI  ''TOENAILS TRIM.''  Review of Systems no new findings or systemic changes noted     Objective:   Physical Exam Lower extremity vascular status is diminished with pedal pulses DP plus one over 4 PT thready nonpalpable bilateral catheter refill time 3 seconds all patient is a wheelchair and minimally ambulatory skin texture and turgor normal mild varicosities mild + edema noted neurologically epicritic and proprioceptive sensations is somewhat diminished on Semmes Weinstein to forefoot digits there is normal plantar response DTRs not elicited dermatologically skin color pigment normal hair growth absent nails thick brittle crumbly friable dystrophic 1 through 5 bilateral painful tender both and debridement and on palpation. Patient also has distal clavus hemorrhage a keratoses distal to the nail plate of the second digit left foot due to rigid digital contracture hemorrhage a keratosis no active ulcer however severe large almost cutaneous hornlike lesion is identified second toe left. Diffuse keratoses plantar foot no acute keratoses no open wounds no ulcers no secondary infections       Assessment & Plan:  Assessment this time patient does have some vascular compromise diminished likely peripheral vascular disease arterial sclerosis patient does have thick dystrophic friable brittle crumbly mycotic nails 1 through 5 bilateral debrided as far as a keratosis in keratotic lesion distal clavus second digit left is debrided to with lumicain and Neosporin Band-Aid dressing for one day followup in 3 months for an as-needed basis for future palliative care maintain appropriate coming shoes padding cushion all times no active infection ulcer noted at this time given suggested to 3 month followup for palliative care  Harriet Masson DPM

## 2014-03-19 DIAGNOSIS — I10 Essential (primary) hypertension: Secondary | ICD-10-CM | POA: Diagnosis not present

## 2014-03-19 DIAGNOSIS — E785 Hyperlipidemia, unspecified: Secondary | ICD-10-CM | POA: Diagnosis not present

## 2014-03-19 DIAGNOSIS — M545 Low back pain: Secondary | ICD-10-CM | POA: Diagnosis not present

## 2014-03-19 DIAGNOSIS — M150 Primary generalized (osteo)arthritis: Secondary | ICD-10-CM | POA: Diagnosis not present

## 2014-04-21 IMAGING — CR DG CHEST 2V
2 series · 2 of 2 positions shown · non-contrast
Comparison: None available.

CLINICAL DATA: Stroke.  Left upper extremity weakness.

EXAM:
CHEST  2 VIEW

[w chest lat * (1 of 2)]
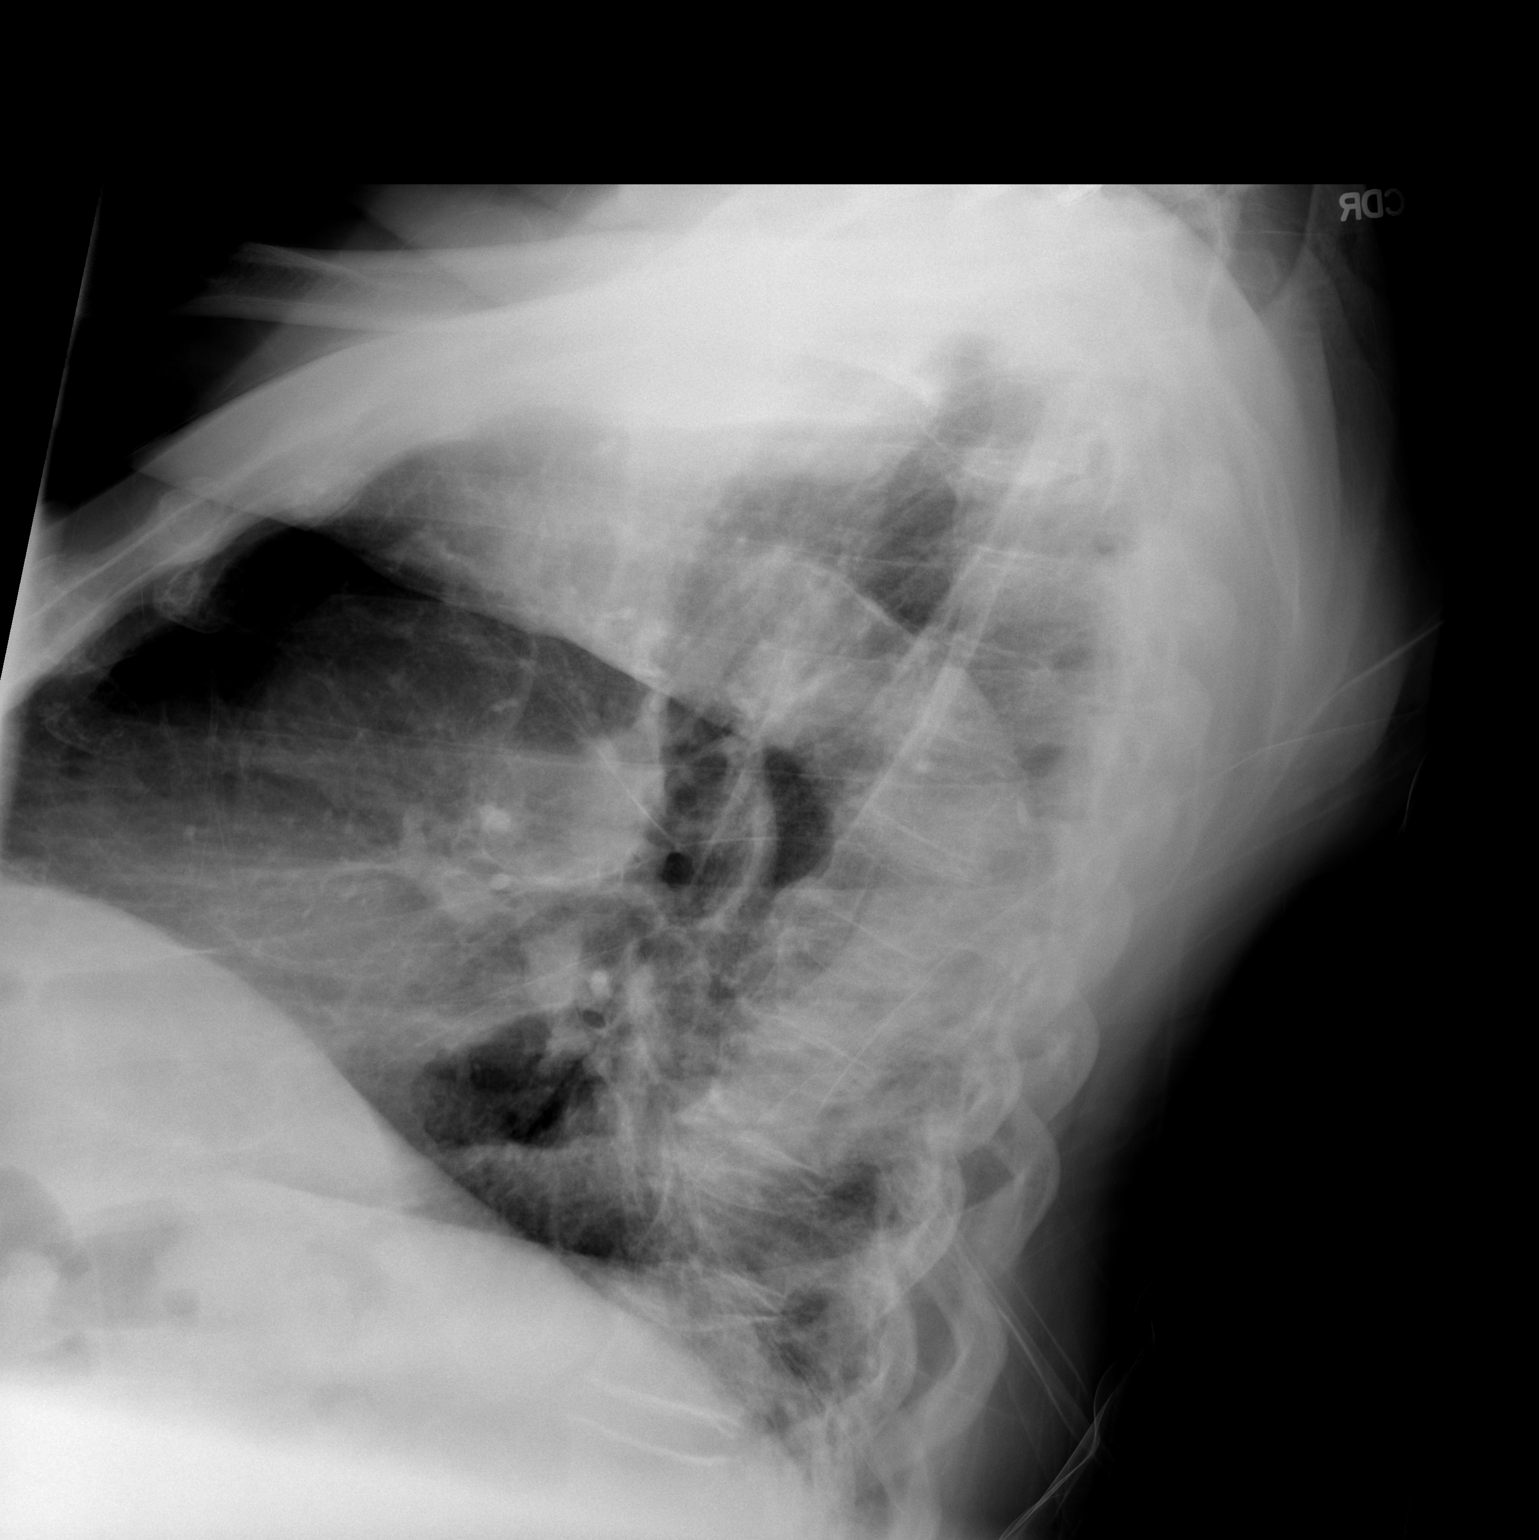

[w chest lat * (2 of 2)]
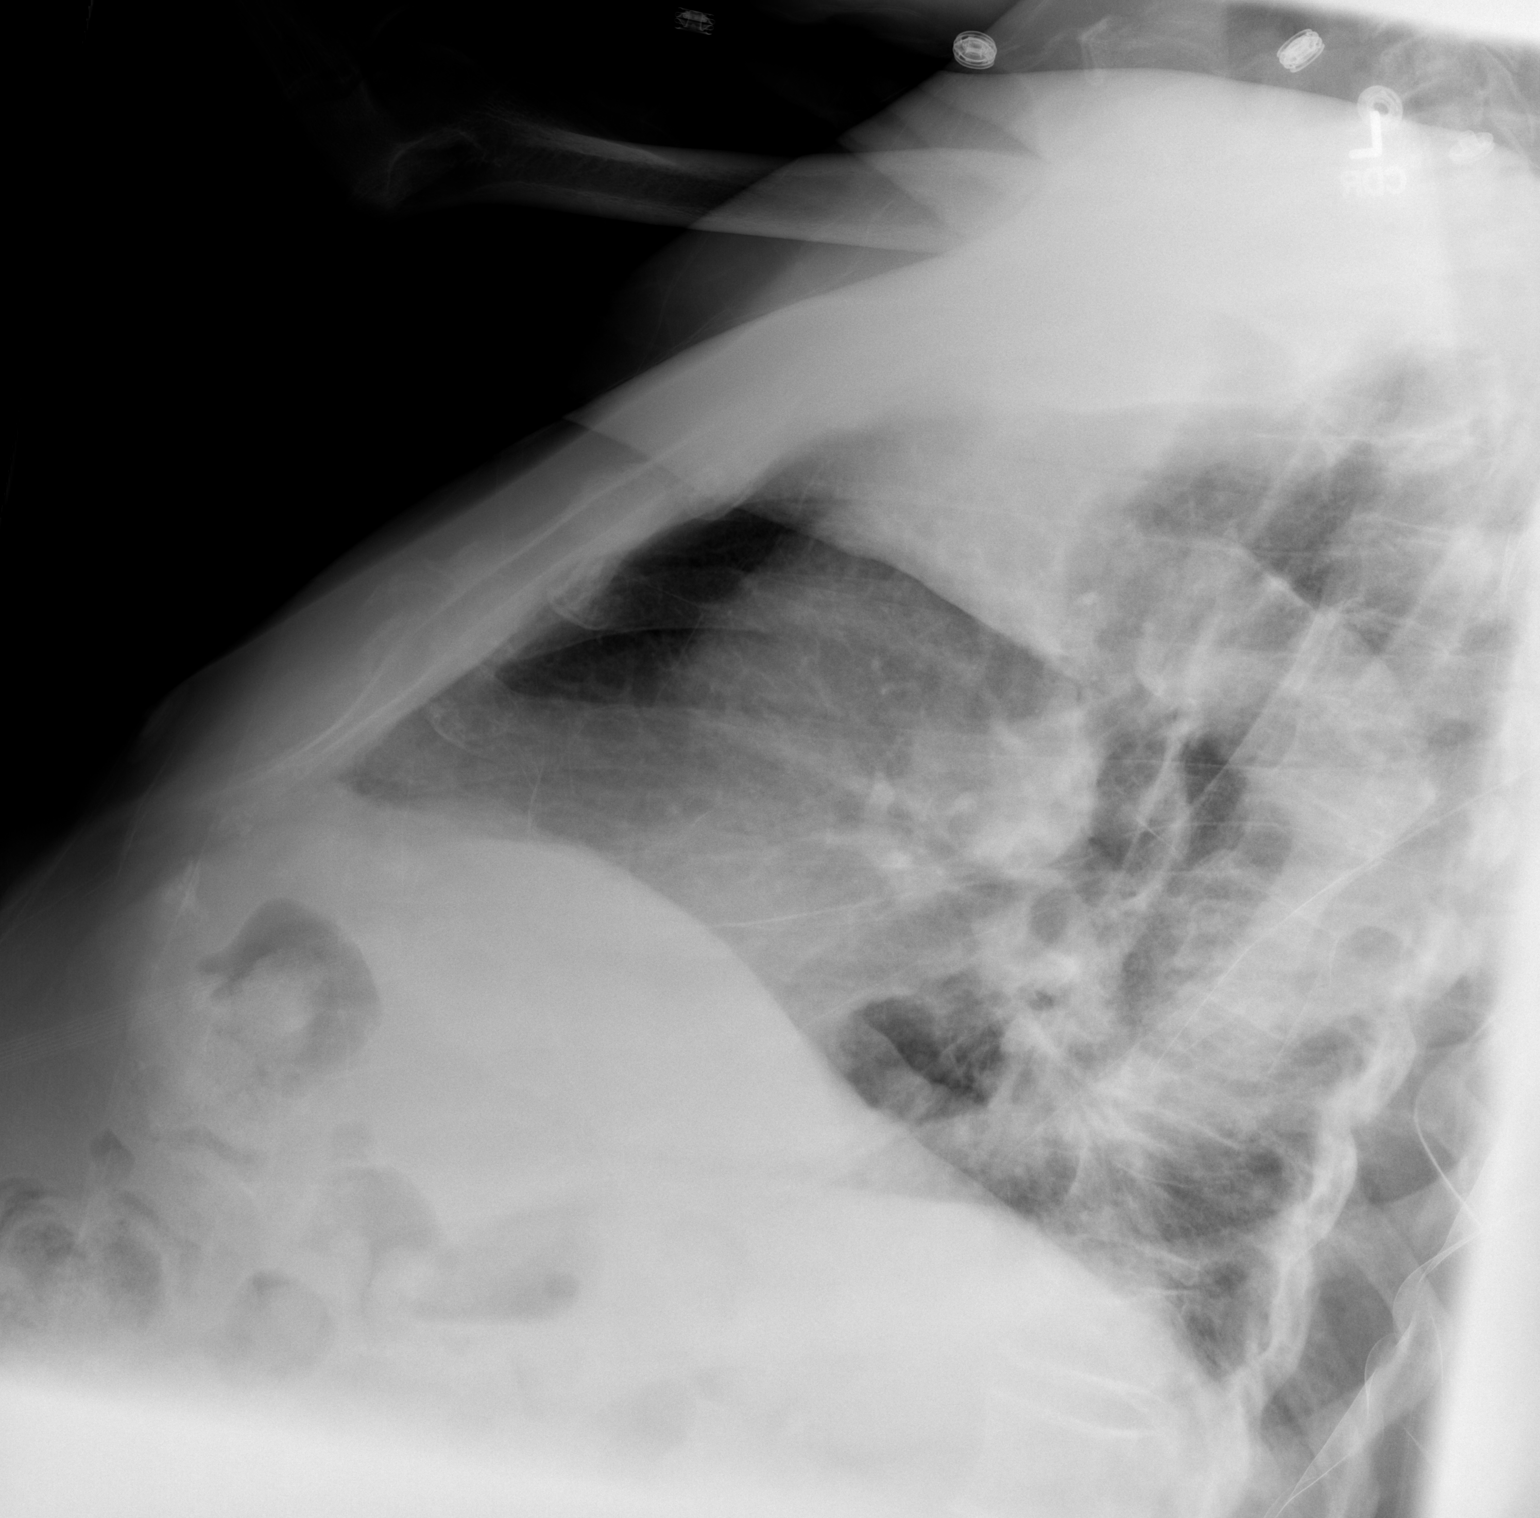

[2 of 2 positions shown; findings below may reference images not displayed]

FINDINGS: Marked cardiopericardial enlargement. No edema or consolidation. No
effusion or pneumothorax. Gas filled lower mediastinal mass
consistent with hiatal hernia. Question 7 mm pulmonary nodule in the
right mid lung.

A lower thoracic body superior endplate is concave (likely T12)
presumably a remote fracture given the history.
IMPRESSION: 1. Cardiomegaly without failure.
2. Hiatal hernia.
3. Possible 7 mm nodule in the right lung.

## 2014-04-29 ENCOUNTER — Other Ambulatory Visit: Payer: BC Managed Care – PPO

## 2014-05-16 DIAGNOSIS — C61 Malignant neoplasm of prostate: Secondary | ICD-10-CM | POA: Diagnosis not present

## 2014-05-27 ENCOUNTER — Ambulatory Visit: Payer: Medicare Other

## 2014-06-30 DIAGNOSIS — E782 Mixed hyperlipidemia: Secondary | ICD-10-CM | POA: Diagnosis not present

## 2014-06-30 DIAGNOSIS — Z79899 Other long term (current) drug therapy: Secondary | ICD-10-CM | POA: Diagnosis not present

## 2014-06-30 DIAGNOSIS — I10 Essential (primary) hypertension: Secondary | ICD-10-CM | POA: Diagnosis not present

## 2014-06-30 DIAGNOSIS — E669 Obesity, unspecified: Secondary | ICD-10-CM | POA: Diagnosis not present

## 2014-06-30 DIAGNOSIS — Z79891 Long term (current) use of opiate analgesic: Secondary | ICD-10-CM | POA: Diagnosis not present

## 2014-06-30 DIAGNOSIS — Z5181 Encounter for therapeutic drug level monitoring: Secondary | ICD-10-CM | POA: Diagnosis not present

## 2014-06-30 DIAGNOSIS — M150 Primary generalized (osteo)arthritis: Secondary | ICD-10-CM | POA: Diagnosis not present

## 2014-07-08 ENCOUNTER — Ambulatory Visit: Payer: Medicare Other

## 2014-07-22 ENCOUNTER — Ambulatory Visit (INDEPENDENT_AMBULATORY_CARE_PROVIDER_SITE_OTHER): Payer: Medicare Other

## 2014-07-22 VITALS — BP 131/77 | HR 76 | Resp 12

## 2014-07-22 DIAGNOSIS — B351 Tinea unguium: Secondary | ICD-10-CM

## 2014-07-22 DIAGNOSIS — M79676 Pain in unspecified toe(s): Secondary | ICD-10-CM

## 2014-07-22 DIAGNOSIS — Q828 Other specified congenital malformations of skin: Secondary | ICD-10-CM

## 2014-07-22 DIAGNOSIS — I739 Peripheral vascular disease, unspecified: Secondary | ICD-10-CM

## 2014-07-22 DIAGNOSIS — G629 Polyneuropathy, unspecified: Secondary | ICD-10-CM

## 2014-07-22 NOTE — Patient Instructions (Signed)

## 2014-07-22 NOTE — Progress Notes (Signed)
   Subjective:    Patient ID: Brett Mora, male    DOB: 1919/07/30, 81 y.o.   MRN: [id number]  HPI  TOENAILS TRIM.  Review of Systems no new findings or systemic changes     Objective:   Physical Exam Neurovascular status unchanged pedal pulses DP plus one over 4 PT thready nonpalpable bilateral temperature temperature warm to cool Refill time 3 seconds patient is minimally endotracheal wheelchair has thick brittle chromic friable friable dystrophic nails 1 through 5 bilateral also severe dry scaled fissured skin bilateral. There is some interdigital maceration and macerated skin which need to be debrided or cleanses more thoroughly. At this time no open wounds no ulcers no secondary infections there is history of keratoses and although no active at this time no secondary infections noted no open wounds or ulcers       Assessment & Plan:  Assessment does have history of vascular compromise diminished pedal pulses are noted nails thick brittle [place] friable dystrophic 1 through 5 bilateral nails are debrided recommended using cream or lotion to the skin once or twice daily as instructed keep the feet clean and dry wash with soap and water apply lotion daily as instructed avoid putting lotion between toes follow dry thoroughly as instructed  Harriet Masson DPM

## 2014-09-18 DIAGNOSIS — C61 Malignant neoplasm of prostate: Secondary | ICD-10-CM | POA: Diagnosis not present

## 2014-10-28 ENCOUNTER — Ambulatory Visit: Payer: Medicare Other | Admitting: Podiatry

## 2014-11-13 ENCOUNTER — Ambulatory Visit: Payer: Medicare Other | Admitting: Podiatry

## 2014-11-25 ENCOUNTER — Ambulatory Visit: Payer: Medicare Other | Admitting: Podiatry

## 2014-12-17 ENCOUNTER — Ambulatory Visit: Payer: Medicare Other | Admitting: Podiatry

## 2015-01-13 DIAGNOSIS — D649 Anemia, unspecified: Secondary | ICD-10-CM | POA: Diagnosis not present

## 2015-01-13 DIAGNOSIS — I10 Essential (primary) hypertension: Secondary | ICD-10-CM | POA: Diagnosis not present

## 2015-01-13 DIAGNOSIS — E669 Obesity, unspecified: Secondary | ICD-10-CM | POA: Diagnosis not present

## 2015-01-13 DIAGNOSIS — Z79899 Other long term (current) drug therapy: Secondary | ICD-10-CM | POA: Diagnosis not present

## 2015-01-22 DIAGNOSIS — C61 Malignant neoplasm of prostate: Secondary | ICD-10-CM | POA: Diagnosis not present

## 2015-02-25 ENCOUNTER — Ambulatory Visit (INDEPENDENT_AMBULATORY_CARE_PROVIDER_SITE_OTHER): Payer: Medicare Other | Admitting: Podiatry

## 2015-02-25 ENCOUNTER — Encounter: Payer: Self-pay | Admitting: Podiatry

## 2015-02-25 DIAGNOSIS — M79676 Pain in unspecified toe(s): Secondary | ICD-10-CM | POA: Diagnosis not present

## 2015-02-25 DIAGNOSIS — B351 Tinea unguium: Secondary | ICD-10-CM

## 2015-02-26 NOTE — Patient Instructions (Signed)
Apply all-purpose skin lotion to legs and feet daily

## 2015-02-26 NOTE — Progress Notes (Signed)
Patient ID: Brett Mora, male   DOB: October 23, 1919, 81 y.o.   MRN: [id number]  Subjective: This patient presents today complaining of painful toenails and requesting nail debridement. His son is present in the treatment room. Last visit for a similar service was on 07/22/2014 provided by Dr. Blenda Mounts  Objective: Patient appears orientated 3  Vascular: DP pulses 1 over 4 bilaterally PT pulses trace palpable bilaterally Delay capillary filling time   Dermatological: Dry skin dorsally plantarly and lower legs bilaterally The toenails are elongated, brittle, hypertrophic, discolored and tender direct palpation 6-10  Assessment: Symptomatic onychomycoses 6-10 Dry skin bilaterally  Plan: Debridement of toenails 10 mechanically and electrically without any bleeding  Patient advised to apply skin moisturizing to legs and feet daily  Reappoint at three-month

## 2015-06-03 ENCOUNTER — Encounter: Payer: Self-pay | Admitting: Podiatry

## 2015-06-03 ENCOUNTER — Ambulatory Visit (INDEPENDENT_AMBULATORY_CARE_PROVIDER_SITE_OTHER): Payer: Medicare Other | Admitting: Podiatry

## 2015-06-03 DIAGNOSIS — B351 Tinea unguium: Secondary | ICD-10-CM | POA: Diagnosis not present

## 2015-06-03 DIAGNOSIS — M79676 Pain in unspecified toe(s): Secondary | ICD-10-CM | POA: Diagnosis not present

## 2015-06-04 NOTE — Progress Notes (Signed)
Patient ID: Brett Mora, male   DOB: July 23, 1919, 82 y.o.   MRN: [id number]  Subjective: This patient presents for ongoing debridement of elongated and thickened toenails which uncomfortable wearing shoes at approximately three-month intervals  Objective: Patient is responsive and appears orientated 3 The patient's grandson present in treatment room  No open skin lesions bilaterally Dry scaling skin bilaterally The toenails are hypertrophic, elongated, incurvated, discolored and tender to direct palpation 6-10  Assessment: Debridement toenails 6-10 Dry skin bilaterally  Plan: Debridement toenails 6-10 mechanically and electrical without any bleeding Patient advised to apply daily skin moisturizing to your legs and feet  Reappoint 3 months

## 2015-06-16 DIAGNOSIS — C61 Malignant neoplasm of prostate: Secondary | ICD-10-CM | POA: Diagnosis not present

## 2015-06-22 ENCOUNTER — Other Ambulatory Visit: Payer: Self-pay | Admitting: Urology

## 2015-06-22 DIAGNOSIS — C61 Malignant neoplasm of prostate: Secondary | ICD-10-CM

## 2015-06-22 DIAGNOSIS — R972 Elevated prostate specific antigen [PSA]: Secondary | ICD-10-CM

## 2015-07-08 ENCOUNTER — Encounter (HOSPITAL_COMMUNITY)
Admission: RE | Admit: 2015-07-08 | Discharge: 2015-07-08 | Disposition: A | Discharge status: Home / Self Care | Payer: Medicare Other | Source: Ambulatory Visit | Attending: Urology | Admitting: Urology

## 2015-07-08 ENCOUNTER — Encounter (HOSPITAL_COMMUNITY): Payer: Medicare Other

## 2015-07-08 DIAGNOSIS — C61 Malignant neoplasm of prostate: Secondary | ICD-10-CM | POA: Diagnosis not present

## 2015-07-08 DIAGNOSIS — R972 Elevated prostate specific antigen [PSA]: Secondary | ICD-10-CM | POA: Diagnosis not present

## 2015-07-08 DIAGNOSIS — K449 Diaphragmatic hernia without obstruction or gangrene: Secondary | ICD-10-CM | POA: Diagnosis not present

## 2015-09-08 ENCOUNTER — Encounter: Payer: Self-pay | Admitting: Podiatry

## 2015-09-08 ENCOUNTER — Ambulatory Visit (INDEPENDENT_AMBULATORY_CARE_PROVIDER_SITE_OTHER): Payer: Medicare Other | Admitting: Podiatry

## 2015-09-08 DIAGNOSIS — B351 Tinea unguium: Secondary | ICD-10-CM

## 2015-09-08 DIAGNOSIS — M79676 Pain in unspecified toe(s): Secondary | ICD-10-CM | POA: Diagnosis not present

## 2015-09-08 NOTE — Progress Notes (Signed)
Patient ID: Brett Mora, male   DOB: 06/03/1919, 82 y.o.   MRN: [id number]  Subjective: This patient presents for ongoing debridement of elongated and thickened toenails which uncomfortable wearing shoes at approximately three-month intervals the patient's grandson is present in the treatment room  Objective: Patient is responsive and appears orientated 3 The patient's grandson present in treatment room  No open skin lesions bilaterally Dry scaling skin bilaterally The toenails are hypertrophic, elongated, incurvated, discolored and tender to direct palpation 6-10  Assessment: Debridement toenails 6-10 Dry skin bilaterally  Plan: Debridement toenails 6-10 mechanically and electrically with bleeding distal fourth right toe treated with antibiotic ointment and Band-Aid. Patient advised removed Band-Aid 1-3 days and continue applying topical antibiotic ointment daily until a scab forms  Patient advised to apply daily skin moisturizing to your legs and feet  Reappoint 3 months

## 2015-09-08 NOTE — Patient Instructions (Signed)
Removed Band-Aid on fourth right toe 1-3 days and apply topical antibiotic ointment until a scab forms daily Use all-purpose skin lotion on your feet daily to treat your dry skin

## 2015-10-14 DIAGNOSIS — N400 Enlarged prostate without lower urinary tract symptoms: Secondary | ICD-10-CM | POA: Diagnosis not present

## 2015-10-14 DIAGNOSIS — C61 Malignant neoplasm of prostate: Secondary | ICD-10-CM | POA: Diagnosis not present

## 2015-12-08 ENCOUNTER — Ambulatory Visit: Payer: Medicare Other | Admitting: Podiatry

## 2015-12-22 ENCOUNTER — Encounter: Payer: Self-pay | Admitting: Podiatry

## 2015-12-22 ENCOUNTER — Ambulatory Visit (INDEPENDENT_AMBULATORY_CARE_PROVIDER_SITE_OTHER): Payer: Medicare Other | Admitting: Podiatry

## 2015-12-22 DIAGNOSIS — B351 Tinea unguium: Secondary | ICD-10-CM

## 2015-12-22 DIAGNOSIS — M79676 Pain in unspecified toe(s): Secondary | ICD-10-CM | POA: Diagnosis not present

## 2015-12-22 NOTE — Progress Notes (Signed)
Patient ID: Brett Mora, male   DOB: May 19, 1919, 82 y.o.   MRN: [id number]    Subjective: This patient presents for ongoing debridement of elongated and thickened toenails which uncomfortable wearing shoes at approximately three-month intervals the patient's grandson is present in the treatment room  Objective: Patient is responsive and appears orientated 3 The patient's grandson present in treatment room  No open skin lesions bilaterally Dry scaling skin bilaterally The toenails are hypertrophic, elongated, incurvated, discolored and tender to direct palpation 6-10  Assessment: Debridement toenails 6-10 Dry skin bilaterally  Plan: Debridement toenails 6-10 mechanically and electrically without any bleeding  Reappoint 3 months

## 2016-02-04 DIAGNOSIS — Z741 Need for assistance with personal care: Secondary | ICD-10-CM | POA: Diagnosis not present

## 2016-02-04 DIAGNOSIS — E782 Mixed hyperlipidemia: Secondary | ICD-10-CM | POA: Diagnosis not present

## 2016-02-04 DIAGNOSIS — I10 Essential (primary) hypertension: Secondary | ICD-10-CM | POA: Diagnosis not present

## 2016-02-04 DIAGNOSIS — R7309 Other abnormal glucose: Secondary | ICD-10-CM | POA: Diagnosis not present

## 2016-02-04 DIAGNOSIS — K0889 Other specified disorders of teeth and supporting structures: Secondary | ICD-10-CM | POA: Diagnosis not present

## 2016-02-19 DIAGNOSIS — C61 Malignant neoplasm of prostate: Secondary | ICD-10-CM | POA: Diagnosis not present

## 2016-02-19 DIAGNOSIS — N400 Enlarged prostate without lower urinary tract symptoms: Secondary | ICD-10-CM | POA: Diagnosis not present

## 2016-03-22 ENCOUNTER — Ambulatory Visit: Payer: Medicare Other | Admitting: Podiatry

## 2016-04-13 ENCOUNTER — Encounter: Payer: Self-pay | Admitting: Podiatry

## 2016-04-13 ENCOUNTER — Ambulatory Visit (INDEPENDENT_AMBULATORY_CARE_PROVIDER_SITE_OTHER): Payer: Medicare Other | Admitting: Podiatry

## 2016-04-13 VITALS — BP 145/73 | HR 76 | Resp 18

## 2016-04-13 DIAGNOSIS — B351 Tinea unguium: Secondary | ICD-10-CM | POA: Diagnosis not present

## 2016-04-13 DIAGNOSIS — M79676 Pain in unspecified toe(s): Secondary | ICD-10-CM

## 2016-04-13 NOTE — Progress Notes (Signed)
Patient ID: Brett Mora, male   DOB: 03/24/1920, 82 y.o.   MRN: [id number]    Subjective: This patient presents for ongoing debridement of elongated and thickened toenails which uncomfortable wearing shoes at approximately three-month intervals the patient's grandson is present in the treatment room    Objective: Hard of hearing orientated 3 Bilateral peripheral pitting edema DP and PT pulses 1/4 bilaterally Sensation to 10 g monofilament wire intact 5/5 right 4/5 left Vibratory sensation reactive bilaterally Ankle reflexes were equal reactive bilaterally Manual motor testing: Dorsi flexion, plantar flexion 5/5 bilaterally Patient transfers from wheelchair to treatment chair No open skin lesions bilaterally Dry scaling skin bilaterally The toenails are hypertrophic, elongated, incurvated, discolored and tender to direct palpation 6-10  Assessment: Mycotic toenails 6-10 Dry skin bilaterally  Plan: Debridement toenails 6-10 mechanically and electrically without any bleeding Instructed patient to use skin lotion twice a day  Reappoint 3 months 

## 2016-04-24 ENCOUNTER — Encounter (HOSPITAL_COMMUNITY): Payer: Self-pay | Admitting: *Deleted

## 2016-04-24 ENCOUNTER — Ambulatory Visit (HOSPITAL_COMMUNITY)
Admission: EM | Admit: 2016-04-24 | Discharge: 2016-04-24 | Disposition: A | Discharge status: Home / Self Care | Payer: Medicare Other | Attending: Emergency Medicine | Admitting: Emergency Medicine

## 2016-04-24 DIAGNOSIS — S46001A Unspecified injury of muscle(s) and tendon(s) of the rotator cuff of right shoulder, initial encounter: Secondary | ICD-10-CM

## 2016-04-24 HISTORY — DX: Cerebral infarction, unspecified: I63.9

## 2016-04-24 MED ORDER — TRAMADOL HCL 50 MG PO TABS: 50.0000 mg | ORAL_TABLET | Freq: Four times a day (QID) | ORAL | 0 refills | Status: DC | PRN

## 2016-04-24 NOTE — ED Provider Notes (Signed)
[facility]    CSN: [id number] Arrival date & time: 04/24/16  1422     History   Chief Complaint Chief Complaint  Patient presents with  . Shoulder Pain    HPI Brett Mora is a 82 y.o. male.   HPI  He is a 82 year old man here with his family for evaluation of right shoulder pain. The pain started 2 days ago. He reports pain in the top and back of his shoulder that radiates down to his elbow. He is unable to abduct the arm without significant pain. He is able to do additional other movements, but it is painful. He denies any injury or trauma. He has taken Tylenol with minimal improvement.  Past Medical History:  Diagnosis Date  . Hyperlipemia   . Hypertension   . Iron deficiency   . Stroke [facility])     Patient Active Problem List   Diagnosis Date Noted  . Hyperlipidemia 07/13/2013  . Solitary pulmonary nodule 07/13/2013  . CVA (cerebral infarction) 07/11/2013  . Hypertension 07/11/2013    Past Surgical History:  Procedure Laterality Date  . BACK SURGERY         Home Medications    Prior to Admission medications   Medication Sig Start Date End Date Taking? Authorizing Provider  amLODipine (NORVASC) 5 MG tablet Take 5 mg by mouth daily.   Yes Historical Provider, MD  Calcium Carbonate-Vitamin D (CALCIUM 600+D) 600-400 MG-UNIT per tablet Take 1 tablet by mouth 3 (three) times daily with meals.    Yes Historical Provider, MD  Cholecalciferol (VITAMIN D PO) Take 1 tablet by mouth daily.   Yes Historical Provider, MD  hydrochlorothiazide (HYDRODIURIL) 25 MG tablet Take 25 mg by mouth daily.   Yes Historical Provider, MD  IRON PO Take by mouth.   Yes Historical Provider, MD  levocetirizine (XYZAL) 5 MG tablet Take 5 mg by mouth daily.    Yes Historical Provider, MD  simvastatin (ZOCOR) 20 MG tablet Take 20 mg by mouth daily.   Yes Historical Provider, MD  valsartan (DIOVAN) 160 MG tablet Take 160 mg by mouth daily.   Yes Historical Provider, MD  OVER THE  COUNTER MEDICATION Take 1 tablet by mouth daily. Iron with stool softener 150 mg-100 mg extended release    Historical Provider, MD  traMADol (ULTRAM) 50 MG tablet Take 1 tablet (50 mg total) by mouth every 6 (six) hours as needed. 04/24/16   Melony Overly, MD    Family History No family history on file.  Social History Social History  Substance Use Topics  . Smoking status: Former Research scientist (life sciences)  . Smokeless tobacco: Not on file  . Alcohol use No     Allergies   Patient has no known allergies.   Review of Systems Review of Systems As in history of present illness  Physical Exam Triage Vital Signs ED Triage Vitals  Enc Vitals Group     BP 04/24/16 1606 138/68     Pulse Rate 04/24/16 1606 81     Resp 04/24/16 1606 18     Temp 04/24/16 1606 98.2 F (36.8 C)     Temp Source 04/24/16 1606 Oral     SpO2 04/24/16 1606 96 %     Weight --      Height --      Head Circumference --      Peak Flow --      Pain Score 04/24/16 1605 10     Pain Loc --  Pain Edu? --      Excl. in [place]? --    No data found.   Updated Vital Signs BP 138/68   Pulse 81   Temp 98.2 F (36.8 C) (Oral)   Resp 18   SpO2 96%   Visual Acuity Right Eye Distance:   Left Eye Distance:   Bilateral Distance:    Right Eye Near:   Left Eye Near:    Bilateral Near:     Physical Exam  Constitutional: He is oriented to person, place, and time. He appears well-developed and well-nourished. No distress.  Cardiovascular: Normal rate.   Pulmonary/Chest: Effort normal.  Musculoskeletal:  Right shoulder: No edema. No obvious deformity. He is tender across the posterior and top of the shoulder. He has pain with active abduction. I can passively abduct the shoulder 30 before he starts to resist. He is unable to hold the arm in abduction. 2+ radial pulse. Full range of motion of the elbow.  Neurological: He is alert and oriented to person, place, and time.     UC Treatments / Results  Labs (all labs ordered  are listed, but only abnormal results are displayed) Labs Reviewed - No data to display  EKG  EKG Interpretation None       Radiology No results found.  Procedures Procedures (including critical care time)  Medications Ordered in UC Medications - No data to display   Initial Impression / Assessment and Plan / UC Course  I have reviewed the triage vital signs and the nursing notes.  Pertinent labs & imaging results that were available during my care of the patient were reviewed by me and considered in my medical decision making (see chart for details).  Clinical Course     I suspect he has a complete tear of the rotator cuff given his inability to hold arm in abduction. We'll place in a sling for comfort. Tylenol for pain. Prescription given for tramadol to use as needed for severe pain. Discussed using this with extreme caution with his family. Follow-up with orthopedics for additional evaluation.  Final Clinical Impressions(s) / UC Diagnoses   Final diagnoses:  Rotator cuff injury, right, initial encounter    New Prescriptions New Prescriptions   TRAMADOL (ULTRAM) 50 MG TABLET    Take 1 tablet (50 mg total) by mouth every 6 (six) hours as needed.     Melony Overly, MD 04/24/16 [phone]

## 2016-04-24 NOTE — ED Triage Notes (Signed)
C/O right shoulder pain radiating down into right upper arm x 2 days without injury.  Denies any change in parasthesias (has CVA hx).  Has tried heating pad, muscle rub, massaging without any improvement.

## 2016-04-24 NOTE — Discharge Instructions (Signed)
You have likely torn your rotator cuff. Wear the sling for comfort. Remove it several times a day to gently move your shoulder. Apply ice 3 times a day for 20 minutes. Take Tylenol every 8 hours for pain. Use the tramadol every 8 hours as needed for severe pain. This medicine may make you lightheaded or dizzy. Please call Dr. Berenice Primas, the orthopedic doctor, and set up an appointment.

## 2016-04-29 DIAGNOSIS — M25511 Pain in right shoulder: Secondary | ICD-10-CM | POA: Diagnosis not present

## 2016-06-13 DIAGNOSIS — N400 Enlarged prostate without lower urinary tract symptoms: Secondary | ICD-10-CM | POA: Diagnosis not present

## 2016-06-13 DIAGNOSIS — C61 Malignant neoplasm of prostate: Secondary | ICD-10-CM | POA: Diagnosis not present

## 2016-06-28 DIAGNOSIS — Z5111 Encounter for antineoplastic chemotherapy: Secondary | ICD-10-CM | POA: Diagnosis not present

## 2016-06-28 DIAGNOSIS — C61 Malignant neoplasm of prostate: Secondary | ICD-10-CM | POA: Diagnosis not present

## 2016-07-13 ENCOUNTER — Encounter: Payer: Self-pay | Admitting: Podiatry

## 2016-07-13 ENCOUNTER — Ambulatory Visit (INDEPENDENT_AMBULATORY_CARE_PROVIDER_SITE_OTHER): Payer: Medicare Other | Admitting: Podiatry

## 2016-07-13 DIAGNOSIS — I739 Peripheral vascular disease, unspecified: Secondary | ICD-10-CM | POA: Diagnosis not present

## 2016-07-13 DIAGNOSIS — M79676 Pain in unspecified toe(s): Secondary | ICD-10-CM | POA: Diagnosis not present

## 2016-07-13 DIAGNOSIS — B351 Tinea unguium: Secondary | ICD-10-CM

## 2016-07-13 NOTE — Patient Instructions (Signed)
Apply skin lotion to your feet twice a day

## 2016-07-13 NOTE — Progress Notes (Signed)
Patient ID: Brett Mora, male   DOB: 1920/05/03, 83 y.o.   MRN: [id number]    Subjective: This patient presents for ongoing debridement of elongated and thickened toenails which uncomfortable wearing shoes at approximately three-month intervals the patient's grandson is present in the treatment room    Objective: Hard of hearing orientated 3 Bilateral peripheral pitting edema DP and PT pulses 1/4 bilaterally Sensation to 10 g monofilament wire intact 5/5 right 4/5 left Vibratory sensation reactive bilaterally Ankle reflexes were equal reactive bilaterally Manual motor testing: Dorsi flexion, plantar flexion 5/5 bilaterally Patient transfers from wheelchair to treatment chair No open skin lesions bilaterally Dry scaling skin bilaterally The toenails are hypertrophic, elongated, incurvated, discolored and tender to direct palpation 6-10  Assessment: Mycotic toenails 6-10 Dry skin bilaterally  Plan: Debridement toenails 6-10 mechanically and electrically without any bleeding Instructed patient to use skin lotion twice a day  Reappoint 3 months

## 2016-09-22 ENCOUNTER — Encounter (HOSPITAL_COMMUNITY): Payer: Self-pay | Admitting: Emergency Medicine

## 2016-09-22 ENCOUNTER — Emergency Department (HOSPITAL_COMMUNITY)
Admission: EM | Admit: 2016-09-22 | Discharge: 2016-09-23 | Disposition: A | Discharge status: Home / Self Care | Payer: Medicare Other | Attending: Emergency Medicine | Admitting: Emergency Medicine

## 2016-09-22 ENCOUNTER — Emergency Department (HOSPITAL_COMMUNITY): Payer: Medicare Other

## 2016-09-22 DIAGNOSIS — Z79899 Other long term (current) drug therapy: Secondary | ICD-10-CM | POA: Diagnosis not present

## 2016-09-22 DIAGNOSIS — R531 Weakness: Secondary | ICD-10-CM | POA: Insufficient documentation

## 2016-09-22 DIAGNOSIS — I10 Essential (primary) hypertension: Secondary | ICD-10-CM | POA: Diagnosis not present

## 2016-09-22 DIAGNOSIS — Z8673 Personal history of transient ischemic attack (TIA), and cerebral infarction without residual deficits: Secondary | ICD-10-CM | POA: Insufficient documentation

## 2016-09-22 DIAGNOSIS — I6789 Other cerebrovascular disease: Secondary | ICD-10-CM | POA: Diagnosis not present

## 2016-09-22 DIAGNOSIS — Z87891 Personal history of nicotine dependence: Secondary | ICD-10-CM | POA: Diagnosis not present

## 2016-09-22 DIAGNOSIS — R5383 Other fatigue: Secondary | ICD-10-CM | POA: Diagnosis not present

## 2016-09-22 HISTORY — DX: Encounter for other specified aftercare: Z51.89

## 2016-09-22 LAB — CBC WITH DIFFERENTIAL/PLATELET
Basophils Absolute: 0 10*3/uL (ref 0.0–0.1)
Basophils Relative: 0 %
Eosinophils Absolute: 0.1 10*3/uL (ref 0.0–0.7)
Eosinophils Relative: 1 %
HEMATOCRIT: 38.1 % — AB (ref 39.0–52.0)
Hemoglobin: 12.7 g/dL — ABNORMAL LOW (ref 13.0–17.0)
LYMPHS PCT: 20 %
Lymphs Abs: 1.3 10*3/uL (ref 0.7–4.0)
MCH: 28.1 pg (ref 26.0–34.0)
MCHC: 33.3 g/dL (ref 30.0–36.0)
MCV: 84.3 fL (ref 78.0–100.0)
MONOS PCT: 10 %
Monocytes Absolute: 0.6 10*3/uL (ref 0.1–1.0)
NEUTROS ABS: 4.2 10*3/uL (ref 1.7–7.7)
Neutrophils Relative %: 69 %
Platelets: 218 10*3/uL (ref 150–400)
RBC: 4.52 MIL/uL (ref 4.22–5.81)
RDW: 14.8 % (ref 11.5–15.5)
WBC: 6.2 10*3/uL (ref 4.0–10.5)

## 2016-09-22 LAB — TYPE AND SCREEN
ABO/RH(D): B POS
Antibody Screen: NEGATIVE

## 2016-09-22 LAB — BASIC METABOLIC PANEL
ANION GAP: 11 (ref 5–15)
BUN: 23 mg/dL — AB (ref 6–20)
CALCIUM: 10.3 mg/dL (ref 8.9–10.3)
CO2: 25 mmol/L (ref 22–32)
Chloride: 101 mmol/L (ref 101–111)
Creatinine, Ser: 1.51 mg/dL — ABNORMAL HIGH (ref 0.61–1.24)
GFR calc Af Amer: 43 mL/min — ABNORMAL LOW (ref >60)
GFR calc non Af Amer: 37 mL/min — ABNORMAL LOW (ref >60)
GLUCOSE: 105 mg/dL — AB (ref 65–99)
POTASSIUM: 3.5 mmol/L (ref 3.5–5.1)
Sodium: 137 mmol/L (ref 135–145)

## 2016-09-22 MED ORDER — SODIUM CHLORIDE 0.9 % IV BOLUS (SEPSIS)
500.0000 mL | Freq: Once | INTRAVENOUS | Status: AC
Start: 2016-09-22 — End: 2016-09-23
  Administered 2016-09-22: 500 mL via INTRAVENOUS

## 2016-09-22 NOTE — ED Triage Notes (Signed)
Per EMS, pt from home with c/o weakness starting at 1300 today. Pt states no change in eating/drinking, still able to walk around as usual, just feeling weak. Pt anemic and normally takes iron pills, has been out for a while. EMS vitals: BP-142/64, P-70, RR-16, SpO2-98% room air CBG-118

## 2016-09-22 NOTE — ED Notes (Signed)
Patient transported to MRI 

## 2016-09-22 NOTE — ED Provider Notes (Addendum)
[place] [facility] DEPT Provider Note   CSN: [id number] Arrival date & time: 09/22/16  2009     History   Chief Complaint Chief Complaint  Patient presents with  . Weakness    HPI Brett Mora is a 83 y.o. male.  HPI  83 year old male presents with generalized weakness and fatigue. He states this started a couple days ago and has been intermittent. Was worse this afternoon. Is most prominent whenever he sits or stands up. He feels dizzy which then goes away if he stands still. Feels like when he required a blood transfusion a couple years ago. He has been off of his iron pills for the last 2 weeks and has been unable to get a refill. He denies any chest pain, headache, shortness of breath, vomiting, or focal weakness or numbness. He has chronic left arm weakness from a prior stroke but this is not worse or different than before. No melena or bloody stools.  Past Medical History:  Diagnosis Date  . Blood transfusion without reported diagnosis   . Hyperlipemia   . Hypertension   . Iron deficiency   . Stroke [facility])     Patient Active Problem List   Diagnosis Date Noted  . Hyperlipidemia 07/13/2013  . Solitary pulmonary nodule 07/13/2013  . CVA (cerebral infarction) 07/11/2013  . Hypertension 07/11/2013    Past Surgical History:  Procedure Laterality Date  . BACK SURGERY         Home Medications    Prior to Admission medications   Medication Sig Start Date End Date Taking? Authorizing Provider  amLODipine (NORVASC) 5 MG tablet Take 5 mg by mouth daily.    [provider]  Calcium Carbonate-Vitamin D (CALCIUM 600+D) 600-400 MG-UNIT per tablet Take 1 tablet by mouth 3 (three) times daily with meals.     [provider]  Cholecalciferol (VITAMIN D PO) Take 1 tablet by mouth daily.    [provider]  hydrochlorothiazide (HYDRODIURIL) 25 MG tablet Take 25 mg by mouth daily.    [provider]  IRON PO Take by mouth.    [provider]  levocetirizine (XYZAL) 5 MG tablet Take 5 mg by mouth daily.     [provider]  OVER THE COUNTER MEDICATION Take 1 tablet by mouth daily. Iron with stool softener 150 mg-100 mg extended release    [provider]  simvastatin (ZOCOR) 20 MG tablet Take 20 mg by mouth daily.    [provider]  traMADol (ULTRAM) 50 MG tablet Take 1 tablet (50 mg total) by mouth every 6 (six) hours as needed. 04/24/16   Melony Overly, MD  valsartan (DIOVAN) 160 MG tablet Take 160 mg by mouth daily.    [provider]    Family History No family history on file.  Social History Social History  Substance Use Topics  . Smoking status: Former Research scientist (life sciences)  . Smokeless tobacco: Never Used  . Alcohol use No     Allergies   Patient has no known allergies.   Review of Systems Review of Systems  Constitutional: Positive for fatigue.  Respiratory: Negative for shortness of breath.   Cardiovascular: Negative for chest pain.  Gastrointestinal: Negative for abdominal pain, blood in stool, diarrhea and vomiting.  Genitourinary: Negative for dysuria.  Neurological: Positive for dizziness.  All other systems reviewed and are negative.    Physical Exam Updated Vital Signs BP 123/67   Pulse 66   Temp 98.2 F (36.8 C) (Oral)  Resp (!) 21   SpO2 96%   Physical Exam  Constitutional: He is oriented to person, place, and time. He appears well-developed and well-nourished. No distress.  HENT:  Head: Normocephalic and atraumatic.  Right Ear: External ear normal.  Left Ear: External ear normal.  Nose: Nose normal.  Eyes: Right eye exhibits no discharge. Left eye exhibits no discharge.  Neck: Neck supple.  Cardiovascular: Normal rate, regular rhythm and normal heart sounds.   Pulmonary/Chest: Effort normal and breath sounds normal.  Abdominal: Soft. There is no tenderness.  Musculoskeletal: He exhibits no edema.  Neurological: He is alert and oriented to person, place,  and time.  CN 3-12 grossly intact. 5/5 strength in RUE, RLE, LLE. 4/5 strength in LUE (stable per patient). Grossly normal sensation. Normal finger to nose.   Skin: Skin is warm and dry. He is not diaphoretic.  Nursing note and vitals reviewed.    ED Treatments / Results  Labs (all labs ordered are listed, but only abnormal results are displayed) Labs Reviewed  BASIC METABOLIC PANEL - Abnormal; Notable for the following:       Result Value   Glucose, Bld 105 (*)    BUN 23 (*)    Creatinine, Ser 1.51 (*)    GFR calc non Af Amer 37 (*)    GFR calc Af Amer 43 (*)    All other components within normal limits  CBC WITH DIFFERENTIAL/PLATELET - Abnormal; Notable for the following:    Hemoglobin 12.7 (*)    HCT 38.1 (*)    All other components within normal limits  TYPE AND SCREEN    EKG  EKG Interpretation  Date/Time:  Friday Sep 23 2016 00:21:20 EDT Ventricular Rate:  60 PR Interval:    QRS Duration: 175 QT Interval:  477 QTC Calculation: 477 R Axis:   -44 Text Interpretation:  Normal sinus rhythm Short PR interval Right bundle branch block Baseline wander in lead(s) V5 V6 no significant change since Mar 2015 Confirmed by Sherwood Gambler [phone]) on 09/23/2016 12:23:40 AM       Radiology Mr Brain Wo Contrast  Result Date: 09/23/2016 CLINICAL DATA:  Weakness and fatigue EXAM: MRI HEAD WITHOUT CONTRAST TECHNIQUE: Multiplanar, multiecho pulse sequences of the brain and surrounding structures were obtained without intravenous contrast. COMPARISON:  Brain MRI 07/11/2013 FINDINGS: Brain: The midline structures are normal. There is no focal diffusion restriction to indicate acute infarct. There is diffuse confluent hyperintense T2-weighted signal within the periventricular and deep white matter, most often seen in the setting of chronic microvascular ischemia. Unchanged appearance of old, small bilateral cerebellar infarcts. No intraparenchymal hematoma or chronic microhemorrhage. Brain  volume is normal for age without age-advanced or lobar predominant atrophy. The dura is normal and there is no extra-axial collection. Vascular: Major intracranial arterial and venous sinus flow voids are preserved. Skull and upper cervical spine: The visualized skull base, calvarium, upper cervical spine and extracranial soft tissues are normal. Sinuses/Orbits: No fluid levels or advanced mucosal thickening. No mastoid effusion. Normal orbits. IMPRESSION: Advanced chronic microvascular ischemia without acute infarct. Old, small bilateral cerebellar infarcts are unchanged. Electronically Signed   By: Ulyses Jarred M.D.   On: 09/23/2016 00:14    Procedures Procedures (including critical care time)  Medications Ordered in ED Medications  sodium chloride 0.9 % bolus 500 mL (500 mLs Intravenous New Bag/Given 09/22/16 2303)     Initial Impression / Assessment and Plan / ED Course  I have reviewed the triage vital signs and  the nursing notes.  Pertinent labs & imaging results that were available during my care of the patient were reviewed by me and considered in my medical decision making (see chart for details).     No significant findings that would suggest obvious cause for his intermittent dizziness. He is overall well-appearing. He has chronic changes from his prior stroke but nothing new. His hemoglobin is mildly low at 12.7 but this should not be causing his current symptoms. Thus will get MRI after discussion with patient given he seems to have dizziness upon standing only and prior old CVA. Creatinine is mildly elevated from 2015 but not consistent with new acute renal failure. Will give IV fluids. He is eating and drinking normally was able to ambulate normally. MRI negative for acute CVA. ECG without changes from prior. No clear source, f/u with PCP. Discussed return precautions.  Final Clinical Impressions(s) / ED Diagnoses   Final diagnoses:  Generalized weakness    New  Prescriptions New Prescriptions   No medications on file     Sherwood Gambler, MD 09/22/16 [place], MD 09/23/16 [phone]

## 2016-09-22 NOTE — ED Notes (Signed)
Pt ambulated approx 20 steps with standby assist. Pt denied any dizziness, weakness, lightheadedness during such. Pt had normal gait per family.

## 2016-09-22 NOTE — ED Notes (Signed)
Two RN's attempted IV start, not successful.

## 2016-09-23 DIAGNOSIS — R531 Weakness: Secondary | ICD-10-CM | POA: Diagnosis not present

## 2016-09-30 DIAGNOSIS — I10 Essential (primary) hypertension: Secondary | ICD-10-CM | POA: Diagnosis not present

## 2016-09-30 DIAGNOSIS — E782 Mixed hyperlipidemia: Secondary | ICD-10-CM | POA: Diagnosis not present

## 2016-09-30 DIAGNOSIS — Z09 Encounter for follow-up examination after completed treatment for conditions other than malignant neoplasm: Secondary | ICD-10-CM | POA: Diagnosis not present

## 2016-10-19 ENCOUNTER — Ambulatory Visit: Payer: Medicare Other | Admitting: Podiatry

## 2016-10-31 DIAGNOSIS — C61 Malignant neoplasm of prostate: Secondary | ICD-10-CM | POA: Diagnosis not present

## 2016-11-23 ENCOUNTER — Ambulatory Visit (INDEPENDENT_AMBULATORY_CARE_PROVIDER_SITE_OTHER): Payer: Medicare Other | Admitting: Podiatry

## 2016-11-23 DIAGNOSIS — R531 Weakness: Secondary | ICD-10-CM | POA: Diagnosis not present

## 2016-11-24 NOTE — Progress Notes (Signed)
Patient ID: Brett Mora, male   DOB: 1919-12-11, 83 y.o.   MRN: [id number]   No-show, erroneous encounter

## 2017-01-19 DIAGNOSIS — R531 Weakness: Secondary | ICD-10-CM | POA: Diagnosis not present

## 2017-01-25 ENCOUNTER — Other Ambulatory Visit: Payer: Self-pay

## 2017-01-25 ENCOUNTER — Emergency Department (HOSPITAL_COMMUNITY)
Admission: EM | Admit: 2017-01-25 | Discharge: 2017-01-26 | Disposition: A | Discharge status: Home / Self Care | Payer: Medicare Other | Attending: Emergency Medicine | Admitting: Emergency Medicine

## 2017-01-25 DIAGNOSIS — Z79899 Other long term (current) drug therapy: Secondary | ICD-10-CM | POA: Insufficient documentation

## 2017-01-25 DIAGNOSIS — Z8673 Personal history of transient ischemic attack (TIA), and cerebral infarction without residual deficits: Secondary | ICD-10-CM | POA: Diagnosis not present

## 2017-01-25 DIAGNOSIS — I129 Hypertensive chronic kidney disease with stage 1 through stage 4 chronic kidney disease, or unspecified chronic kidney disease: Secondary | ICD-10-CM | POA: Diagnosis not present

## 2017-01-25 DIAGNOSIS — E785 Hyperlipidemia, unspecified: Secondary | ICD-10-CM | POA: Diagnosis not present

## 2017-01-25 DIAGNOSIS — R42 Dizziness and giddiness: Secondary | ICD-10-CM | POA: Diagnosis not present

## 2017-01-25 DIAGNOSIS — E876 Hypokalemia: Secondary | ICD-10-CM | POA: Insufficient documentation

## 2017-01-25 DIAGNOSIS — N189 Chronic kidney disease, unspecified: Secondary | ICD-10-CM | POA: Diagnosis not present

## 2017-01-25 DIAGNOSIS — R404 Transient alteration of awareness: Secondary | ICD-10-CM | POA: Diagnosis not present

## 2017-01-25 LAB — I-STAT CHEM 8, ED
BUN: 29 mg/dL — AB (ref 6–20)
CALCIUM ION: 1.1 mmol/L — AB (ref 1.15–1.40)
CREATININE: 1.6 mg/dL — AB (ref 0.61–1.24)
Chloride: 100 mmol/L — ABNORMAL LOW (ref 101–111)
Glucose, Bld: 113 mg/dL — ABNORMAL HIGH (ref 65–99)
HCT: 40 % (ref 39.0–52.0)
Hemoglobin: 13.6 g/dL (ref 13.0–17.0)
Potassium: 3.4 mmol/L — ABNORMAL LOW (ref 3.5–5.1)
Sodium: 137 mmol/L (ref 135–145)
TCO2: 26 mmol/L (ref 22–32)

## 2017-01-25 MED ORDER — POTASSIUM CHLORIDE CRYS ER 20 MEQ PO TBCR
40.0000 meq | EXTENDED_RELEASE_TABLET | Freq: Once | ORAL | Status: AC
  Administered 2017-01-25: 40 meq via ORAL
  Filled 2017-01-25: qty 2

## 2017-01-25 MED ORDER — METOCLOPRAMIDE HCL 5 MG/ML IJ SOLN: 5.0000 mg | Freq: Once | INTRAMUSCULAR | Status: DC

## 2017-01-25 MED ORDER — DIPHENHYDRAMINE HCL 50 MG/ML IJ SOLN: 25.0000 mg | Freq: Once | INTRAMUSCULAR | Status: DC

## 2017-01-25 NOTE — Discharge Instructions (Signed)
Call your primary care physician to arrange to be seen in the office if you don't feel normal in the next few days or return if you feel worse for any reason.

## 2017-01-25 NOTE — ED Provider Notes (Addendum)
[place] DEPT Provider Note   CSN: [id number] Arrival date & time: 01/25/17  2140     History   Chief Complaint Chief Complaint  Patient presents with  . Dizziness    HPI Brett Mora is a 83 y.o. male.patient complained of dizziness upon getting up from bed this evening for a few minutes. He also complains of congestion in his ears and his voice doesn't sound right. No treatment prior to coming here He is not dizzy at present. He denies fever denies headache denies cough denies chest pain no shortness of breath. EMS was called by his grandson who states his voice sounds "a little shaky, though it sounds are right.  HPI  Past Medical History:  Diagnosis Date  . Blood transfusion without reported diagnosis   . Hyperlipemia   . Hypertension   . Iron deficiency   . Stroke [facility])     Patient Active Problem List   Diagnosis Date Noted  . Hyperlipidemia 07/13/2013  . Solitary pulmonary nodule 07/13/2013  . CVA (cerebral infarction) 07/11/2013  . Hypertension 07/11/2013    Past Surgical History:  Procedure Laterality Date  . BACK SURGERY         Home Medications    Prior to Admission medications   Medication Sig Start Date End Date Taking? Authorizing Provider  amLODipine (NORVASC) 5 MG tablet Take 5 mg by mouth daily.    [provider]  Calcium Carbonate-Vitamin D (CALCIUM 600+D) 600-400 MG-UNIT per tablet Take 1 tablet by mouth 3 (three) times daily with meals.     [provider]  Cholecalciferol (VITAMIN D PO) Take 1 tablet by mouth daily.    [provider]  hydrochlorothiazide (HYDRODIURIL) 25 MG tablet Take 25 mg by mouth daily.    [provider]  IRON PO Take by mouth.    [provider]  levocetirizine (XYZAL) 5 MG tablet Take 5 mg by mouth daily.     [provider]  OVER THE COUNTER MEDICATION Take 1 tablet by mouth daily. Iron with stool softener 150 mg-100 mg extended release    [provider]  simvastatin (ZOCOR) 20 MG tablet Take 20 mg by mouth daily.    [provider]  traMADol (ULTRAM) 50 MG tablet Take 1 tablet (50 mg total) by mouth every 6 (six) hours as needed. 04/24/16   Melony Overly, MD  valsartan (DIOVAN) 160 MG tablet Take 160 mg by mouth daily.    [provider]    Family History No family history on file.  Social History Social History  Substance Use Topics  . Smoking status: Former Research scientist (life sciences)  . Smokeless tobacco: Never Used  . Alcohol use No   DO NOT RESUSCITATE CODE STATUS  Allergies   Patient has no known allergies.   Review of Systems Review of Systems  HENT: Positive for congestion and voice change.        Ear congestion  Respiratory: Negative.   Cardiovascular: Negative.   Gastrointestinal: Negative.   Musculoskeletal: Positive for gait problem.       Walks with walker  Skin: Negative.   Neurological: Positive for dizziness and weakness.       Chronic left-sided weakness as result of stroke  Psychiatric/Behavioral: Negative.   All other systems reviewed and are negative.    Physical Exam Updated Vital Signs BP 123/67 (BP Location: Right Arm)   Pulse 68   Temp 98.3 F (36.8 C) (Oral)   Resp 16  SpO2 100%   Physical Exam  Constitutional:  chronically ill-appearing  HENT:  Head: Normocephalic and atraumatic.  Right Ear: External ear normal.  Left Ear: External ear normal.  No facial asymmetry.  Eyes: Pupils are equal, round, and reactive to light. Conjunctivae are normal.  Neck: Neck supple. No tracheal deviation present. No thyromegaly present.  Cardiovascular: Normal rate and regular rhythm.   No murmur heard. Pulmonary/Chest: Effort normal and breath sounds normal.  Abdominal: Soft. Bowel sounds are normal. He exhibits no distension. There is no tenderness.  Musculoskeletal: Normal range of motion. He exhibits no edema or tenderness.  Left hand with muscular atrophy. All other extremities  without deformity or swelling, neurovascular intact  Neurological: He is alert. No cranial nerve deficit. Coordination normal.  Skin: Skin is warm and dry. No rash noted.  Psychiatric: He has a normal mood and affect.  Nursing note and vitals reviewed.    ED Treatments / Results  Labs (all labs ordered are listed, but only abnormal results are displayed) Labs Reviewed  I-STAT CHEM 8, ED    EKG  EKG Interpretation  Date/Time:  Wednesday January 25 2017 21:37:50 EDT Ventricular Rate:  74 PR Interval:  212 QRS Duration: 162 QT Interval:  442 QTC Calculation: 490 R Axis:   -44 Text Interpretation:  Sinus rhythm with 1st degree A-V block with Premature atrial complexes with Abberant conduction Left axis deviation Right bundle branch block Anterolateral infarct , age undetermined Abnormal ECG No significant change since last tracing Confirmed by Orlie Dakin [phone]) on 01/25/2017 10:08:36 PM       Radiology No results found.  Procedures Procedures (including critical care time)  Medications Ordered in ED Medications - No data to display  Results for orders placed or performed during the hospital encounter of 01/25/17  I-stat chem 8, ed  Result Value Ref Range   Sodium 137 135 - 145 mmol/L   Potassium 3.4 (L) 3.5 - 5.1 mmol/L   Chloride 100 (L) 101 - 111 mmol/L   BUN 29 (H) 6 - 20 mg/dL   Creatinine, Ser 1.60 (H) 0.61 - 1.24 mg/dL   Glucose, Bld 113 (H) 65 - 99 mg/dL   Calcium, Ion 1.10 (L) 1.15 - 1.40 mmol/L   TCO2 26 22 - 32 mmol/L   Hemoglobin 13.6 13.0 - 17.0 g/dL   HCT 40.0 39.0 - 52.0 %   No results found. Initial Impression / Assessment and Plan / ED Course  I have reviewed the triage vital signs and the nursing notes.  Pertinent labs & imaging results that were available during my care of the patient were reviewed by me and considered in my medical decision making (see chart for details).     Patient is able to walk with a walker without  difficulty. He looks at baseline to multiple family members who are here with him. He'll receive potassium chloride 40 mEq orally prior to discharge.renal insufficiency is chronic No focal neurologic deficit or sign ofacute  stroke. Final Clinical Impressions(s) / ED Diagnoses  Diagnosis #1 dizziness #2 hypokalemia Final diagnoses:  None   #3 chronic renal insufficiency New Prescriptions New Prescriptions   No medications on file     Orlie Dakin, MD 01/25/17 [id number]    Orlie Dakin, MD 01/25/17 [phone]

## 2017-01-25 NOTE — ED Triage Notes (Signed)
Per [facility], Pt called out for change in voice. Pt reports he feels his voice sounds different, head is stopped up. Pt reports 1 episode of dizziness 20 minutes prior to arrival. Pt A&O x4, voice is clear. Pt has R hand contracted per chronic CVA.

## 2017-02-28 ENCOUNTER — Emergency Department (HOSPITAL_COMMUNITY): Payer: Medicare Other

## 2017-02-28 ENCOUNTER — Encounter (HOSPITAL_COMMUNITY): Payer: Self-pay | Admitting: Emergency Medicine

## 2017-02-28 ENCOUNTER — Emergency Department (HOSPITAL_COMMUNITY)
Admission: EM | Admit: 2017-02-28 | Discharge: 2017-02-28 | Disposition: A | Discharge status: Home / Self Care | Payer: Medicare Other | Attending: Emergency Medicine | Admitting: Emergency Medicine

## 2017-02-28 DIAGNOSIS — R109 Unspecified abdominal pain: Secondary | ICD-10-CM | POA: Diagnosis not present

## 2017-02-28 DIAGNOSIS — K5730 Diverticulosis of large intestine without perforation or abscess without bleeding: Secondary | ICD-10-CM | POA: Diagnosis not present

## 2017-02-28 DIAGNOSIS — Z79899 Other long term (current) drug therapy: Secondary | ICD-10-CM | POA: Diagnosis not present

## 2017-02-28 DIAGNOSIS — Z87891 Personal history of nicotine dependence: Secondary | ICD-10-CM | POA: Insufficient documentation

## 2017-02-28 DIAGNOSIS — N281 Cyst of kidney, acquired: Secondary | ICD-10-CM | POA: Diagnosis not present

## 2017-02-28 DIAGNOSIS — Z8673 Personal history of transient ischemic attack (TIA), and cerebral infarction without residual deficits: Secondary | ICD-10-CM | POA: Diagnosis not present

## 2017-02-28 DIAGNOSIS — I10 Essential (primary) hypertension: Secondary | ICD-10-CM | POA: Diagnosis not present

## 2017-02-28 DIAGNOSIS — R1084 Generalized abdominal pain: Secondary | ICD-10-CM | POA: Diagnosis not present

## 2017-02-28 DIAGNOSIS — M549 Dorsalgia, unspecified: Secondary | ICD-10-CM | POA: Diagnosis not present

## 2017-02-28 LAB — BASIC METABOLIC PANEL
Anion gap: 11 (ref 5–15)
BUN: 21 mg/dL — AB (ref 6–20)
CHLORIDE: 96 mmol/L — AB (ref 101–111)
CO2: 31 mmol/L (ref 22–32)
CREATININE: 1.41 mg/dL — AB (ref 0.61–1.24)
Calcium: 9.9 mg/dL (ref 8.9–10.3)
GFR calc Af Amer: 47 mL/min — ABNORMAL LOW (ref >60)
GFR, EST NON AFRICAN AMERICAN: 41 mL/min — AB (ref >60)
GLUCOSE: 121 mg/dL — AB (ref 65–99)
POTASSIUM: 3.5 mmol/L (ref 3.5–5.1)
SODIUM: 138 mmol/L (ref 135–145)

## 2017-02-28 LAB — URINALYSIS, ROUTINE W REFLEX MICROSCOPIC
Bilirubin Urine: NEGATIVE
Glucose, UA: NEGATIVE mg/dL
HGB URINE DIPSTICK: NEGATIVE
Ketones, ur: NEGATIVE mg/dL
Leukocytes, UA: NEGATIVE
Nitrite: NEGATIVE
Protein, ur: NEGATIVE mg/dL
SPECIFIC GRAVITY, URINE: 1.013 (ref 1.005–1.030)
pH: 8 (ref 5.0–8.0)

## 2017-02-28 LAB — CBC WITH DIFFERENTIAL/PLATELET
BASOS ABS: 0 10*3/uL (ref 0.0–0.1)
BASOS PCT: 0 %
EOS ABS: 0.1 10*3/uL (ref 0.0–0.7)
Eosinophils Relative: 2 %
HEMATOCRIT: 37.2 % — AB (ref 39.0–52.0)
Hemoglobin: 12.5 g/dL — ABNORMAL LOW (ref 13.0–17.0)
Lymphocytes Relative: 29 %
Lymphs Abs: 1.8 10*3/uL (ref 0.7–4.0)
MCH: 28.4 pg (ref 26.0–34.0)
MCHC: 33.6 g/dL (ref 30.0–36.0)
MCV: 84.5 fL (ref 78.0–100.0)
MONO ABS: 0.5 10*3/uL (ref 0.1–1.0)
MONOS PCT: 7 %
Neutro Abs: 3.9 10*3/uL (ref 1.7–7.7)
Neutrophils Relative %: 62 %
PLATELETS: 179 10*3/uL (ref 150–400)
RBC: 4.4 MIL/uL (ref 4.22–5.81)
RDW: 14.4 % (ref 11.5–15.5)
WBC: 6.3 10*3/uL (ref 4.0–10.5)

## 2017-02-28 MED ORDER — OXYCODONE-ACETAMINOPHEN 5-325 MG PO TABS
1.0000 | ORAL_TABLET | Freq: Once | ORAL | Status: AC
  Administered 2017-02-28: 1 via ORAL
  Filled 2017-02-28: qty 1

## 2017-02-28 MED ORDER — TRAMADOL HCL 50 MG PO TABS: 50.0000 mg | ORAL_TABLET | Freq: Four times a day (QID) | ORAL | 0 refills | Status: DC | PRN

## 2017-02-28 NOTE — ED Notes (Signed)
Pt verbalized understanding of d/c instructions and has no further questions. Pt is stable, A&Ox4, VSS.  

## 2017-02-28 NOTE — ED Provider Notes (Signed)
[facility] EMERGENCY DEPARTMENT Provider Note   CSN: [id number] Arrival date & time: 02/28/17  0040     History   Chief Complaint Chief Complaint  Patient presents with  . Flank Pain    HPI Brett Mora is a 83 y.o. male.  The history is provided by the patient and medical records.  Flank Pain     83 y.o. M with hx of HLP, HTN, iron deficiency, prior stroke, presenting to the ED for left flank pain.  States this began about 3 days ago.  No injury/trauma.  States pain is dull, aching.  Denies difficulty urinating,hematuria, or dysuria.  BM's have been normal.  Eating well, no nausea/vomiting.  Report similar pain several years ago-- states he is not sure what it was, he was given a "shot" and felt better.  Unsure if he has ever had kidney stones.  Past Medical History:  Diagnosis Date  . Blood transfusion without reported diagnosis   . Hyperlipemia   . Hypertension   . Iron deficiency   . Stroke [facility])     Patient Active Problem List   Diagnosis Date Noted  . Hyperlipidemia 07/13/2013  . Solitary pulmonary nodule 07/13/2013  . CVA (cerebral infarction) 07/11/2013  . Hypertension 07/11/2013    Past Surgical History:  Procedure Laterality Date  . BACK SURGERY         Home Medications    Prior to Admission medications   Medication Sig Start Date End Date Taking? Authorizing Provider  amLODipine (NORVASC) 5 MG tablet Take 5 mg by mouth daily.    [provider]  Calcium Carbonate-Vitamin D (CALCIUM 600+D) 600-400 MG-UNIT per tablet Take 1 tablet by mouth 3 (three) times daily with meals.     [provider]  Cholecalciferol (VITAMIN D PO) Take 1 tablet by mouth daily.    [provider]  hydrochlorothiazide (HYDRODIURIL) 25 MG tablet Take 25 mg by mouth daily.    [provider]  IRON PO Take by mouth.    [provider]  levocetirizine (XYZAL) 5 MG tablet Take 5 mg by mouth daily.     [provider]  OVER THE COUNTER MEDICATION Take 1 tablet by mouth daily. Iron with stool softener 150 mg-100 mg extended release    [provider]  simvastatin (ZOCOR) 20 MG tablet Take 20 mg by mouth daily.    [provider]  traMADol (ULTRAM) 50 MG tablet Take 1 tablet (50 mg total) by mouth every 6 (six) hours as needed. 04/24/16   Melony Overly, MD  valsartan (DIOVAN) 160 MG tablet Take 160 mg by mouth daily.    [provider]    Family History History reviewed. No pertinent family history.  Social History Social History  Substance Use Topics  . Smoking status: Former Research scientist (life sciences)  . Smokeless tobacco: Never Used  . Alcohol use No     Allergies   Patient has no known allergies.   Review of Systems Review of Systems  Genitourinary: Positive for flank pain.  All other systems reviewed and are negative.    Physical Exam Updated Vital Signs BP (!) 148/73   Temp 98.3 F (36.8 C) (Oral)   Resp 18   SpO2 93%   Physical Exam  Constitutional: He is oriented to person, place, and time. He appears well-developed and well-nourished.  HENT:  Head: Normocephalic and atraumatic.  Mouth/Throat: Oropharynx is clear and moist.  Eyes: Pupils are equal, round, and reactive  to light. Conjunctivae and EOM are normal.  Neck: Normal range of motion.  Cardiovascular: Normal rate, regular rhythm and normal heart sounds.   Pulmonary/Chest: Effort normal and breath sounds normal. No respiratory distress. He has no wheezes.  Abdominal: Soft. Bowel sounds are normal. There is no tenderness. There is no rebound.  Endorses pain of left flank, no overt CVA tenderness on my exam  Musculoskeletal: Normal range of motion.  Neurological: He is alert and oriented to person, place, and time.  Skin: Skin is warm and dry.  Psychiatric: He has a normal mood and affect.  Nursing note and vitals reviewed.    ED Treatments / Results  Labs (all labs ordered are listed, but  only abnormal results are displayed) Labs Reviewed  CBC WITH DIFFERENTIAL/PLATELET - Abnormal; Notable for the following:       Result Value   Hemoglobin 12.5 (*)    HCT 37.2 (*)    All other components within normal limits  BASIC METABOLIC PANEL - Abnormal; Notable for the following:    Chloride 96 (*)    Glucose, Bld 121 (*)    BUN 21 (*)    Creatinine, Ser 1.41 (*)    GFR calc non Af Amer 41 (*)    GFR calc Af Amer 47 (*)    All other components within normal limits  URINALYSIS, ROUTINE W REFLEX MICROSCOPIC    EKG  EKG Interpretation None       Radiology Ct Renal Stone Study  Result Date: 02/28/2017 CLINICAL DATA:  Acute onset of left-sided abdominal pain. Initial encounter. EXAM: CT ABDOMEN AND PELVIS WITHOUT CONTRAST TECHNIQUE: Multidetector CT imaging of the abdomen and pelvis was performed following the standard protocol without IV contrast. COMPARISON:  CT of the abdomen and pelvis performed 07/08/2015 FINDINGS: Lower chest: Minimal bibasilar atelectasis is noted. A moderate hiatal hernia is seen. Mild calcification is noted at the aortic valve. Hepatobiliary: A 1.5 cm nonspecific hypodensity is noted at the right hepatic lobe. The liver is otherwise unremarkable. The gallbladder is unremarkable in appearance. The common bile duct remains normal in caliber. Pancreas: The pancreas is within normal limits. Spleen: The spleen is unremarkable in appearance. Adrenals/Urinary Tract: The adrenal glands are unremarkable in appearance. Scattered large bilateral renal cysts are noted, with mild peripheral calcification noted at a left renal cyst. There is no evidence of hydronephrosis. No renal or ureteral stones are identified. No perinephric stranding is appreciated. Stomach/Bowel: The stomach is unremarkable in appearance. The small bowel is within normal limits. The appendix is normal in caliber, without evidence of appendicitis. Diffuse diverticulosis is noted along the descending and  proximal sigmoid colon, without evidence of diverticulitis. Vascular/Lymphatic: Scattered calcification is seen along the abdominal aorta and its branches. The abdominal aorta is otherwise grossly unremarkable. The inferior vena cava is grossly unremarkable. No retroperitoneal lymphadenopathy is seen. No pelvic sidewall lymphadenopathy is identified. Reproductive: The bladder is moderately distended and grossly unremarkable. The prostate is enlarged, measuring 5.4 cm in transverse dimension. Other: No additional soft tissue abnormalities are seen. Musculoskeletal: No acute osseous abnormalities are identified. Multilevel vacuum phenomenon is noted along the lumbar spine. The visualized musculature is unremarkable in appearance. IMPRESSION: 1. No acute abnormality seen within the abdomen or pelvis. 2. Diffuse diverticulosis along the descending and proximal sigmoid colon, without evidence of diverticulitis. 3. Moderate hiatal hernia noted. 4. Large bilateral renal cysts, one of which is peripherally calcified on the left. 5. Scattered aortic atherosclerosis. 6. Enlarged prostate. 7. 1.5 cm  nonspecific hypodensity at the right hepatic lobe, somewhat more prominent than in 2017. Electronically Signed   By: Garald Balding M.D.   On: 02/28/2017 01:34    Procedures Procedures (including critical care time)  Medications Ordered in ED Medications  oxyCODONE-acetaminophen (PERCOCET/ROXICET) 5-325 MG per tablet 1 tablet (not administered)     Initial Impression / Assessment and Plan / ED Course  I have reviewed the triage vital signs and the nursing notes.  Pertinent labs & imaging results that were available during my care of the patient were reviewed by me and considered in my medical decision making (see chart for details).  83 year old male here with left flank pain. Has been ongoing for 3 days now. No associated urinary symptoms, nausea, vomiting, or diarrhea. He is afebrile and nontoxic. Endorses pain  of left flank, however no focal CVA tenderness on my exam.  Unsure if any history of kidney stones. We'll plan for screening labs, urinalysis, CT renal study.    Labwork overall reassuring. His creatinine appears around his baseline. UA without any signs of infection.  CT renal study without acute findings.  Feel patient is appropriate for discharge.  will send home with tramadol which has worked well for him in the past.  Close follow-up with PCP.  Discussed plan with patient, he/she acknowledged understanding and agreed with plan of care.  Return precautions given for new or worsening symptoms.  Final Clinical Impressions(s) / ED Diagnoses   Final diagnoses:  Left flank pain    New Prescriptions New Prescriptions   TRAMADOL (ULTRAM) 50 MG TABLET    Take 1 tablet (50 mg total) by mouth every 6 (six) hours as needed.     Larene Pickett, PA-C 02/28/17 0981    Merryl Hacker, MD 03/05/17 [phone]

## 2017-02-28 NOTE — ED Triage Notes (Addendum)
Per EMS: Pt c/o left flank pain x 3 days. Denies hematuria or dysuria. Pt complaining of left flank pain moderate pain. Denies abdominal pain at this time

## 2017-02-28 NOTE — Discharge Instructions (Signed)
Like we discussed, your tests today looked good.   Take the prescribed medication as directed.  You can take tylenol with this if you need to. Follow-up with your primary care doctor. Return to the ED for new or worsening symptoms.

## 2017-03-06 ENCOUNTER — Other Ambulatory Visit: Payer: Self-pay | Admitting: Urology

## 2017-03-06 DIAGNOSIS — C61 Malignant neoplasm of prostate: Secondary | ICD-10-CM | POA: Diagnosis not present

## 2017-03-20 ENCOUNTER — Encounter (HOSPITAL_COMMUNITY): Payer: Medicare Other

## 2017-04-05 DIAGNOSIS — Z23 Encounter for immunization: Secondary | ICD-10-CM | POA: Diagnosis not present

## 2017-04-05 DIAGNOSIS — E782 Mixed hyperlipidemia: Secondary | ICD-10-CM | POA: Diagnosis not present

## 2017-04-05 DIAGNOSIS — R351 Nocturia: Secondary | ICD-10-CM | POA: Diagnosis not present

## 2017-04-05 DIAGNOSIS — H9190 Unspecified hearing loss, unspecified ear: Secondary | ICD-10-CM | POA: Diagnosis not present

## 2017-04-05 DIAGNOSIS — E785 Hyperlipidemia, unspecified: Secondary | ICD-10-CM | POA: Diagnosis not present

## 2017-04-05 DIAGNOSIS — Z0000 Encounter for general adult medical examination without abnormal findings: Secondary | ICD-10-CM | POA: Diagnosis not present

## 2017-04-05 DIAGNOSIS — I10 Essential (primary) hypertension: Secondary | ICD-10-CM | POA: Diagnosis not present

## 2017-04-05 DIAGNOSIS — R7309 Other abnormal glucose: Secondary | ICD-10-CM | POA: Diagnosis not present

## 2017-06-13 ENCOUNTER — Ambulatory Visit: Payer: Medicare Other | Admitting: Podiatry

## 2017-08-24 DIAGNOSIS — C61 Malignant neoplasm of prostate: Secondary | ICD-10-CM | POA: Diagnosis not present

## 2017-08-24 DIAGNOSIS — Z5111 Encounter for antineoplastic chemotherapy: Secondary | ICD-10-CM | POA: Diagnosis not present

## 2017-09-06 ENCOUNTER — Telehealth: Payer: Self-pay | Admitting: Licensed Clinical Social Worker

## 2017-09-06 NOTE — Telephone Encounter (Signed)
Palliative Care SW phoned patient's home and spoke with his grandson who stated he was caring for patient today.  A home visit is scheduled for Thursday, May 8th, 2019, at 1pm.

## 2017-09-13 ENCOUNTER — Other Ambulatory Visit: Payer: Medicare Other | Admitting: *Deleted

## 2017-09-13 ENCOUNTER — Other Ambulatory Visit: Payer: Medicare Other | Admitting: Licensed Clinical Social Worker

## 2017-09-13 ENCOUNTER — Encounter: Payer: Self-pay | Admitting: *Deleted

## 2017-09-13 DIAGNOSIS — Z515 Encounter for palliative care: Secondary | ICD-10-CM

## 2017-09-13 NOTE — Progress Notes (Addendum)
COMMUNITY PALLIATIVE CARE RN NOTE  PATIENT NAME: Brett Mora DOB: 1919/08/13 MRN: [id number]  PRIMARY CARE PROVIDER: Minette Brine  RESPONSIBLE PARTY:  Acct ID - Guarantor Home Phone Work Phone Relationship Acct Type  [id number] DAMIN, SALIDO [phone]  Self P/F     [address], [place], [place] [address]    PLAN OF CARE and INTERVENTION:  1. ADVANCE CARE PLANNING/GOALS OF CARE: Patient is a DNR, however family will contact EMS for any changes in patient's condition 2. PATIENT/CAREGIVER EDUCATION: Chiropodist Palliative Care Services: Reinforced Safety Precautions 3. DISEASE STATUS: Joint visit made with Palliative SW. [name] and daughter present during visit. Patient sitting up in chair in the living room. Very pleasant and engaging. Denies any changes in condition over the past few months. No falls. Denies pain while at rest, but does experiencing some pain in R knee during ambulation. Wears R knee brace for stabilization. Has PRN Tramadol available if needed. Patient able to ambulate using a walker independently. Requires Supervision during showers for safety. Patient has grab bars in his bathroom, elevated toilet seat along, grab bars and hand-held shower nozzle for safety in the bathroom. 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 Wears L wrist brace. Continues with good appetite, eats >3 meals/day at 100%. Takes medications whole with fluids. Grandson helps with medication management.   HISTORY OF PRESENT ILLNESS: This is a 84 yo African American male who continues to be followed by [facility]. Will continue to be followed for symptom management and assess overall condition. Next visit scheduled in 1 month.   CODE STATUS: DNR  ADVANCED DIRECTIVES: Y MOST FORM: no PPS: 40%   PHYSICAL EXAM:   VITALS: Today's Vitals   09/13/17 2123  BP: 118/62  Pulse: 63  Resp: 16  SpO2: 97%  Height: 6\' 3"  (1.905 m)  PainSc: 0-No pain     LUNGS: clear to auscultation  CARDIAC: HR irregular EXTREMITIES: No edema noted SKIN: Exposed skin intact/patient and family deny any skin breakdown  NEURO: Alert and oriented x 3, pleasant and conversational   (Duration of visit and documentation 90 minutes)    Daryl Eastern, RN, BSN

## 2017-09-13 NOTE — Progress Notes (Signed)
COMMUNITY PALLIATIVE CARE SW NOTE  PATIENT NAME: Brett Mora DOB: [birth year] MRN: [id number]  PRIMARY CARE PROVIDER: Minette Brine  RESPONSIBLE PARTY:  Acct ID - Guarantor Home Phone Work Phone Relationship Acct Type  [id number] [name] [phone]  Self P/F     [address], [place], [place] [address]     PLAN OF CARE and INTERVENTIONS:             1. GOALS OF CARE/ ADVANCE CARE PLANNING:  Patient would like to remain in his home and be hospitalized if necessary. 2. SOCIAL/EMOTIONAL/SPIRITUAL ASSESSMENT/ INTERVENTIONS:  Patient is a 84 y/o widowed male who resides in his home.  His grandson, [name], lives with him and is his primary caregiver.  Patient was in the [employer] during [place].  He was in combat, but would not disclose further information.  Patient retired from [employer].  He previously lived in [place].  Patient has two daughters, Hoyle Sauer and [name], who live locally.  Hoyle Sauer recently had hip surgery and was unable to attend the home visit.  [name] and [place] were present and provided psychosocial information about patient and their family.  They appear to be a close family and are supportive of each other.  Patient appeared comfortable and answered questions appropriately.  He is a member of [employer] and stated he misses attending church.  He declined SW offer to contact the church and request a visit. 3. PATIENT/CAREGIVER EDUCATION/ COPING:  Patient copes by expressing himself openly and uses humor.  Patient's grandson and daughter were provided education regarding the Palliative Care service and stated they understood.  Family appears to cope by keeping each other fully informed of patient status. 4. COMMUNITY RESOURCES COORDINATION/ HEALTH CARE NAVIGATION:  Family did not express a need for resources. 5. FINANCIAL/LEGAL CONCERNS/INTERVENTIONS:  Patient receives VA benefits.     SOCIAL HX:  Social History   Tobacco Use  . Smoking status: Former Research scientist (life sciences)  . Smokeless  tobacco: Never Used  Substance Use Topics  . Alcohol use: No    CODE STATUS: DNR  ADVANCED DIRECTIVES: N MOST FORM COMPLETE:  No HOSPICE EDUCATION PROVIDED: SW establishing rapport and will discuss in the future.  PPS:  Patient is able to stand and ambulate with his walker.  His intake is normal. Duration of visit and documentation:  100 minutes.      Creola Corn Aili Casillas, LCSW

## 2017-10-11 ENCOUNTER — Other Ambulatory Visit: Payer: Medicare Other | Admitting: Licensed Clinical Social Worker

## 2017-10-18 ENCOUNTER — Other Ambulatory Visit: Payer: Medicare Other | Admitting: Licensed Clinical Social Worker

## 2017-10-18 ENCOUNTER — Other Ambulatory Visit: Payer: Medicare Other | Admitting: *Deleted

## 2017-10-18 DIAGNOSIS — Z515 Encounter for palliative care: Secondary | ICD-10-CM

## 2017-10-18 NOTE — Progress Notes (Signed)
COMMUNITY PALLIATIVE CARE RN NOTE  PATIENT NAME: Jaclyn Carew DOB: 29-Jul-1919 MRN: [id number]  PRIMARY CARE PROVIDER: Minette Brine  RESPONSIBLE PARTY:  Acct ID - Guarantor Home Phone Work Phone Relationship Acct Type  [id number] JORY, TANGUMA [phone]  Self P/F     [address], [place], [place] [address]    PLAN OF CARE and INTERVENTION:  1. ADVANCE CARE PLANNING/GOALS OF CARE: Remain at home with grandson as caregiver, avoid hospitalizations if possible 2. PATIENT/CAREGIVER EDUCATION: Reinforced Safety Precautions 3. DISEASE STATUS: Joint visit made with [facility]. Patient lying down in his bedroom upon arrival. Observed patient ambulate using his walker into the living room and maneuvers well, just at a slow pace. Patient states that he is not feeling well, but could not describe in detail. Denies pain, but just feels tired. Reports that most days that he feels good overall. Remains able to ambulate using his walker independently. Requires minimal assistance with bathing and dressing. Has Durable Medical Equipment such as hospital bed, grab bars in bathroom and shower, shower bench and elevated toilet seat. Left side weakness from history of stroke with contracture noted to L hand/wrist. Reports having a good appetite, 3 meals/day with snacks in between. Good fluid intake. Taking all medications whole with liquids without any difficulty. Bowels are regular. Denies issues with constipation at this time. Vital signs are stable. Able to engage in appropriate conversation, but is forgetful and has some word finding difficulties. States that he enjoys sitting out on the porch on nice days to get outside of the home. No hospitalizations or falls since last visit.  HISTORY OF PRESENT ILLNESS: This is a 84 yo male who lives at home with his son as his caregiver. Follow up Palliative care visit made to assess overall condition and assist with any symptom management needs. Next visit  scheduled in 1 month.   CODE STATUS: DNR ADVANCED DIRECTIVES: N MOST FORM: no PPS: 40%   PHYSICAL EXAM:   VITALS: Today's Vitals   10/18/17 1300  BP: 130/64  Pulse: 63  Resp: 16  SpO2: 98%  PainSc: 0-No pain    LUNGS: clear to auscultation , decreased breath sounds CARDIAC: Cor irreg EXTREMITIES: No edema SKIN: Exposed skin dry and intact  NEURO: Alert and oriented x 3, pleasant mood, word finding difficulties and forgetfulness, Left sided weakness   (Duration of visit and documentation 60 minutes)    Daryl Eastern, RN, BSN

## 2017-10-19 NOTE — Progress Notes (Signed)
COMMUNITY PALLIATIVE CARE SW NOTE  PATIENT NAME: Brett Mora DOB: 14-Jul-1919 MRN: [id number]  PRIMARY CARE PROVIDER: Minette Brine  RESPONSIBLE PARTY:  Acct ID - Guarantor Home Phone Work Phone Relationship Acct Type  [id number] ZORAN, YANKEE [phone]  Self P/F     [address], [place], [place] [address]     PLAN OF CARE and INTERVENTIONS:             1. GOALS OF CARE/ ADVANCE CARE PLANNING:  Patient wishes to remain in his home and not be hospitalized if possible. 2. SOCIAL/EMOTIONAL/SPIRITUAL ASSESSMENT/ INTERVENTIONS:  SW and Palliative Care RN, Daryl Eastern, met with patient and his grandson.  Patient stated he did not feel well, but could not specify how or where.  He was able to identify himself as 84 years old.  He stated his overall health has been good.  His grandson continues to care for him.  Family keeps each other informed. 3. PATIENT/CAREGIVER EDUCATION/ COPING:  SW provided education regarding role.  Patient continues to use humor as a coping mechanism. 4. PERSONAL EMERGENCY PLAN:  [name] will contact patient's daughter's for guidance. 5. COMMUNITY RESOURCES COORDINATION/ HEALTH CARE NAVIGATION:  Family did not express. 6. FINANCIAL/LEGAL CONCERNS/INTERVENTIONS:  Patient continues to receive VA benefits.     SOCIAL HX:  Social History   Tobacco Use  . Smoking status: Former Research scientist (life sciences)  . Smokeless tobacco: Never Used  Substance Use Topics  . Alcohol use: No    CODE STATUS:  DNR  ADVANCED DIRECTIVES: N MOST FORM COMPLETE:  N HOSPICE EDUCATION PROVIDED: No at this time. PPS:  Patient's intake is normal.  He can stand independently and utilizes his walker.      Creola Corn Mohamadou Maciver, LCSW

## 2017-11-20 ENCOUNTER — Other Ambulatory Visit: Payer: Medicare Other | Admitting: *Deleted

## 2017-11-20 ENCOUNTER — Other Ambulatory Visit: Payer: Medicare Other | Admitting: Licensed Clinical Social Worker

## 2017-11-20 DIAGNOSIS — Z515 Encounter for palliative care: Secondary | ICD-10-CM

## 2017-11-20 NOTE — Progress Notes (Signed)
COMMUNITY PALLIATIVE CARE SW NOTE  PATIENT NAME: Brett Mora DOB: 1920/02/26 MRN: [id number]  PRIMARY CARE PROVIDER: Minette Brine  RESPONSIBLE PARTY:  Acct ID - Guarantor Home Phone Work Phone Relationship Acct Type  [id number] [name], [name] [phone]  Self P/F     [address], [name], [name] [address]     PLAN OF CARE and INTERVENTIONS:             1. GOALS OF CARE/ ADVANCE CARE PLANNING:  Patient does not wish to be hospitalized and wants to remain in his home with his grandson. 2. SOCIAL/EMOTIONAL/SPIRITUAL ASSESSMENT/ INTERVENTIONS:  SW and Palliative Care RN, Brett Mora, met with patient and his grandson, Brett Mora.  Patient was sitting in his recliner upon arrival.  He was alert and oriented.  Patient engaged in life review, including fond memories of music.  Discussed his favorite artists and his stereo system he wished he still had.  Patient talked about loving to eat.  He denied pain, and felt it was a good day.  Brett Mora reported pt has had an itch on his back which the RN addressed.  He also talked about the joy he receives when his grandchildren visit.  He displayed a bright affect and laughed often.  He would begin conversations, in part due to his hearing loss since he was unable to hear other conversations going on.  3. PATIENT/CAREGIVER EDUCATION/ COPING:  Patient and his son continue using humor as coping mechanisms. 4. PERSONAL EMERGENCY PLAN:  [name] contacts his mother for emergency purposes. 5. COMMUNITY RESOURCES COORDINATION/ HEALTH CARE NAVIGATION:  None. 6. FINANCIAL/LEGAL CONCERNS/INTERVENTIONS:  None per patient.  Patient receives VA benefits.     SOCIAL HX:  Social History   Tobacco Use  . Smoking status: Former Research scientist (life sciences)  . Smokeless tobacco: Never Used  Substance Use Topics  . Alcohol use: No    CODE STATUS:  DNR  ADVANCED DIRECTIVES: N MOST FORM COMPLETE:  N HOSPICE EDUCATION PROVIDED:  N PPS:  Patient's appetite is normal.  He is able to stand  and ambulate with a walker.  Duration of visit and documentation:  60 minutes.      Creola Corn Shavy Beachem, LCSW

## 2017-11-22 NOTE — Progress Notes (Signed)
COMMUNITY PALLIATIVE CARE RN NOTE  PATIENT NAME: Brett Mora DOB: 1919/05/12 MRN: [id number]  PRIMARY CARE PROVIDER: Minette Brine  RESPONSIBLE PARTY:  Acct ID - Guarantor Home Phone Work Phone Relationship Acct Type  [id number] Brett Mora [phone]  Self P/F     [address], [place], [place] [address]    PLAN OF CARE and INTERVENTION:  1. ADVANCE CARE PLANNING/GOALS OF CARE: Remain at home with grandson as caregiver, avoid hospitalizations but wants full scope of interventions if necessary 2. PATIENT/CAREGIVER EDUCATION: Reinforced Safe Transfers and Ambulation 3. DISEASE STATUS: Joint visit made with Palliative SW Brett Mora. Patient sitting up in chair in the living room. Remains very pleasant, engaging and smiling. Denies pain at time of visit, but does experience some chronic pain in R knee. Continues to wear knee brace on L knee for stability. Patient remains able to answer questions appropriately and makes needs known. Reports feeling "pretty good" today. Intake remains good. Remains able to ambulate using his walker. Grandson assists patient out of the shower for safety and assists some with dressing. Patient remains able to feed himself independently. L arm weakness and L hand contracture from history of CVA. States that he stays up late watching tv but sleeps well throughout the night. Brett Mora states that patient has been c/o itching on is R mid to lower back region. Area may initially be without any redness or rash, but reports sometimes as the day progresses that there is a red, flat rash noted. Skin is normal in this area today with no rash or redness noted. Brett Mora states that when he washes patient's clothes he uses Gain from others may use something different. Recommended using soaps with mild cleansers and for sensitive skin and stick with what works and doesn't cause skin irritation. Noticing increased hand tremors today which grandson states happens when patient has  something heavy on his mind or his BP is elevated. BP today WNL and patient denies any stress or worries at this time. Otherwise, overall condition is stable.    HISTORY OF PRESENT ILLNESS:  This is a 84 yo male who resides at home with his grandson as caregiver. Palliative Care to continue to visit patient to assess overall condition and assist with any symptom management needs. Next visit scheduled in 1 month.  CODE STATUS: DNR ADVANCED DIRECTIVES: N MOST FORM: no  PPS: 40%   PHYSICAL EXAM:   VITALS: Today's Vitals   11/20/17 1142  BP: 130/70  Pulse: 61  Resp: 18  SpO2: 98%  PainSc: 0-No pain    LUNGS: clear to auscultation  CARDIAC: Cor RRR EXTREMITIES: No edema SKIN: Exposed skin dry and intact  NEURO: Alert and oriented x 3, pleasant mood, HOH, L sided weakness, ambulatory with walker   (Duration of visit and documentation 60 minutes)    Brett Eastern, RN, BSN

## 2017-12-25 ENCOUNTER — Encounter: Payer: Self-pay | Admitting: *Deleted

## 2017-12-25 ENCOUNTER — Other Ambulatory Visit: Payer: Medicare Other | Admitting: Licensed Clinical Social Worker

## 2017-12-25 ENCOUNTER — Other Ambulatory Visit: Payer: Medicare Other | Admitting: *Deleted

## 2017-12-25 DIAGNOSIS — Z515 Encounter for palliative care: Secondary | ICD-10-CM

## 2017-12-25 NOTE — Progress Notes (Signed)
COMMUNITY PALLIATIVE CARE RN NOTE  PATIENT NAME: Brett Mora DOB: December 09, 1919 MRN: [id number]  PRIMARY CARE PROVIDER: Minette Brine  RESPONSIBLE PARTY:  Acct ID - Guarantor Home Phone Work Phone Relationship Acct Type  [id number] Brett, Mora [phone]  Self P/F     [address], [place], [place] [address]    PLAN OF CARE and INTERVENTION:  1. ADVANCE CARE PLANNING/GOALS OF CARE: Remain at home with grandson as caregiver, avoid hospitalizations 2. PATIENT/CAREGIVER EDUCATION: Reinforced Safe Mobility and Transfers 3. DISEASE STATUS:  Joint visit made with [place]. Patient in his room lying down upon arrival. Observed ambulation with walker into the living room. Ambulates at a slow pace, but gait is steady. No recent falls or hospitalizations. Denies pain. Reports feeling "pretty good" today. Requires 1 person assistance with showering and dressing. Remains able to feed himself. Appetite is good. Continues to take his medications whole with thin liquids. No dysphagia. 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 Wears L wrist brace for stability and contracture. Also wears R knee brace. Grandson reports that he no longer has a rash on his R mid back. He changed the body wash he was using and rash has resolved. Will continue to monitor overall condition.   HISTORY OF PRESENT ILLNESS:  This is a 84 yo male who resides at home with his grandson as caregiver. Palliative Care continues to follow. Next visit scheduled in 1 month  CODE STATUS: DNR  ADVANCED DIRECTIVES: N MOST FORM: no PPS: 40%   PHYSICAL EXAM:   VITALS: Today's Vitals   12/25/17 1150  BP: 118/60  Pulse: 60  Resp: 17  SpO2: 99%  PainSc: 0-No pain    LUNGS: clear to auscultation  CARDIAC: Cor RRR EXTREMITIES: No edema SKIN: Skin color, texture, turgor normal. No rashes or lesions  NEURO: Alert and oriented x 4, pleasant mood, occasional bilateral hand/arm tremors, HOH, ambulates with  walker    (Duration of visit and documentation 75 minutes)   Daryl Eastern, RN, BSN

## 2017-12-26 NOTE — Progress Notes (Signed)
COMMUNITY PALLIATIVE CARE SW NOTE  PATIENT NAME: Brett Mora DOB: 02/13/1920 MRN: [id number]  PRIMARY CARE PROVIDER: Minette Brine  RESPONSIBLE PARTY:  Acct ID - Guarantor Home Phone Work Phone Relationship Acct Type  [id number] [name] [phone]  Self P/F     [address], [place], [place] [address]     PLAN OF CARE and INTERVENTIONS:             1. GOALS OF CARE/ ADVANCE CARE PLANNING:  Patient wishes to remain at home with his grandson.  He does not wish to be hospitalized.  Patient has a DNR. 2. SOCIAL/EMOTIONAL/SPIRITUAL ASSESSMENT/ INTERVENTIONS:  SW made a joint visit with Palliative Care RN, Daryl Eastern.  Patient was just getting out of bed.  He ambulated with is walker to his chair in the living room.  Patient's grandson was also present.  Patient stated he felt "good" this morning.  He uses humor often.  Patient appears to enjoy reminiscing about his car and family members.  Patient was alert and oriented x3. 3. PATIENT/CAREGIVER EDUCATION/ COPING:  Continue providing education regarding Palliative Care 4. PERSONAL EMERGENCY PLAN:  Patient's grandson phones his mother or aunt for assistance. 5. COMMUNITY RESOURCES COORDINATION/ HEALTH CARE NAVIGATION:  None 6. FINANCIAL/LEGAL CONCERNS/INTERVENTIONS:  Patient receives VA benefits.     SOCIAL HX:  Social History   Tobacco Use  . Smoking status: Former Research scientist (life sciences)  . Smokeless tobacco: Never Used  Substance Use Topics  . Alcohol use: No    CODE STATUS:  DNR ADVANCED DIRECTIVES: N MOST FORM COMPLETE:  N HOSPICE EDUCATION PROVIDED: N PPS:  Patient's appetite is good.  He is able to stand and uses his walker independently. Duration of visit and documentation:  75 minutes.     Creola Corn Nyala Kirchner, LCSW

## 2018-01-15 DIAGNOSIS — L299 Pruritus, unspecified: Secondary | ICD-10-CM | POA: Diagnosis not present

## 2018-01-15 DIAGNOSIS — E782 Mixed hyperlipidemia: Secondary | ICD-10-CM | POA: Diagnosis not present

## 2018-01-15 DIAGNOSIS — M150 Primary generalized (osteo)arthritis: Secondary | ICD-10-CM | POA: Diagnosis not present

## 2018-01-15 DIAGNOSIS — R972 Elevated prostate specific antigen [PSA]: Secondary | ICD-10-CM | POA: Diagnosis not present

## 2018-01-15 DIAGNOSIS — I10 Essential (primary) hypertension: Secondary | ICD-10-CM | POA: Diagnosis not present

## 2018-01-15 DIAGNOSIS — R7309 Other abnormal glucose: Secondary | ICD-10-CM | POA: Diagnosis not present

## 2018-01-22 ENCOUNTER — Other Ambulatory Visit: Payer: Medicare Other | Admitting: *Deleted

## 2018-01-22 ENCOUNTER — Other Ambulatory Visit: Payer: Medicare Other | Admitting: Licensed Clinical Social Worker

## 2018-01-22 DIAGNOSIS — Z515 Encounter for palliative care: Secondary | ICD-10-CM

## 2018-01-23 NOTE — Progress Notes (Signed)
COMMUNITY PALLIATIVE CARE SW NOTE  PATIENT NAME: Brett Mora DOB: 1919/11/22 MRN: [id number]  PRIMARY CARE PROVIDER: Minette Brine  RESPONSIBLE PARTY:  Acct ID - Guarantor Home Phone Work Phone Relationship Acct Type  [id number] ZAELYN, BARBARY [phone]  Self P/F     [address], [place], [place] [address]     PLAN OF CARE and INTERVENTIONS:             1. GOALS OF CARE/ ADVANCE CARE PLANNING:  Patient's goal is to remain in his home with his grandson.  He does not want to be hospitalized.  Patient has a DNR. 2. SOCIAL/EMOTIONAL/SPIRITUAL ASSESSMENT/ INTERVENTIONS:  SW and Palliative Care RN, Daryl Eastern, met with patient and his grandson.  Patient expressed not having any pain today.  He appeared to enjoy reminiscing about his time in the [employer].  He was able to see the world, and especially enjoyed [place].  SW provided active listening and supportive counseling. 3. PATIENT/CAREGIVER EDUCATION/ COPING:  SW continues to provide education regarding Palliative Care. 4. PERSONAL EMERGENCY PLAN:  Patient will inform his grandson regarding medical concerns.  EMS will be called if necessary. 5. COMMUNITY RESOURCES COORDINATION/ HEALTH CARE NAVIGATION:  None. 6. FINANCIAL/LEGAL CONCERNS/INTERVENTIONS:  Patient received VA benefits.     SOCIAL HX:  Social History   Tobacco Use  . Smoking status: Former Research scientist (life sciences)  . Smokeless tobacco: Never Used  Substance Use Topics  . Alcohol use: No    CODE STATUS:  DNR  ADVANCED DIRECTIVES: N MOST FORM COMPLETE:  N HOSPICE EDUCATION PROVIDED:  N PPS:  Patient reports his appetite is good.  He is able to stand and uses a walker independently. Duration of visit and documentation:  75 minutes.      Creola Corn Fines Kimberlin, LCSW

## 2018-01-25 NOTE — Progress Notes (Signed)
COMMUNITY PALLIATIVE CARE RN NOTE  PATIENT NAME: Brett Mora DOB: 1920-04-10 MRN: [id number]  PRIMARY CARE PROVIDER: Minette Brine  RESPONSIBLE PARTY:  Acct ID - Guarantor Home Phone Work Phone Relationship Acct Type  [id number] RAIF, CHACHERE [phone]  Self P/F     [address], [place], [place] [address]    PLAN OF CARE and INTERVENTION:  1. ADVANCE CARE PLANNING/GOALS OF CARE: Remain at home with his grandson as caregiver, avoid hospitalizations 2. PATIENT/CAREGIVER EDUCATION: Reinforced Safe Mobility/Transfers 3. DISEASE STATUS: Joint visit made with [facility]. Observed patient ambulate using his walker into the living room from his bedroom. Gait slow, but steady. Denies pain at rest, however does experience mild pain in his R knee with ambulation. Continues to wear R knee brace for stability. Also has occasional pain in L wrist/hand, area of contracture. Also wears a brace to L wrist/hand. No dyspnea noted. Grandson states patient had a recent follow up appointment with PCP and labwork. No changes made to medications or plan of care. Continues to require 1 person assistance with dressing, but remains able to shower, toilet and feed himself independently. Pleasant mood. Good food/fluid intake. No dysphagia reported. Denies any issues/concerns at this time. Will continue to monitor overall condition.  HISTORY OF PRESENT ILLNESS:  This is a 84 yo male who resides in his home, with assistance from his grandson. Palliative Care team continues to follow. Next visit scheduled in 1 month.  CODE STATUS: DNR ADVANCED DIRECTIVES: N MOST FORM: no PPS:    PHYSICAL EXAM:   VITALS: Today's Vitals   01/22/18 1115  BP: 140/62  Pulse: (!) 55  Resp: 16  SpO2: 99%  PainSc: 0-No pain    LUNGS: clear to auscultation  CARDIAC: Cor Brady EXTREMITIES: No edema SKIN: Exposed skin dry and intact, denies any skin issues  NEURO: Alert and oriented x 3, pleasant mood, generalized  weakness, ambulatory with walker   (Duration of visit and documentation 75 minutes)    Daryl Eastern, RN, BSN

## 2018-02-14 ENCOUNTER — Telehealth: Payer: Self-pay | Admitting: Nurse Practitioner

## 2018-02-14 NOTE — Telephone Encounter (Signed)
I called the patient to schedule AWV-S in early December (Last AWV 04/05/18).  The patient's grandson said that they will call us back to schedule it. VDM (DD)

## 2018-02-19 ENCOUNTER — Other Ambulatory Visit: Payer: Medicare Other | Admitting: Licensed Clinical Social Worker

## 2018-02-19 ENCOUNTER — Other Ambulatory Visit: Payer: Medicare Other | Admitting: *Deleted

## 2018-02-19 DIAGNOSIS — Z515 Encounter for palliative care: Secondary | ICD-10-CM

## 2018-02-19 NOTE — Progress Notes (Signed)
COMMUNITY PALLIATIVE CARE SW NOTE  PATIENT NAME: Brett Mora DOB: April 20, 1920 MRN: [id number]  PRIMARY CARE PROVIDER: Minette Brine, FNP  RESPONSIBLE PARTY:  Acct ID - Guarantor Home Phone Work Phone Relationship Acct Type  [id number] CORDARRIUS, COAD [phone]  Self P/F     [address], [place], [place] [address]     PLAN OF CARE and INTERVENTIONS:             1. GOALS OF CARE/ ADVANCE CARE PLANNING:  Patient wants to remain at home with his grandson and not be hospitalized.  He has a DNR. 2. SOCIAL/EMOTIONAL/SPIRITUAL ASSESSMENT/ INTERVENTIONS:  SW and Palliative Care RN, Daryl Eastern, met with patient and his grandson, [name].  He was alert and engaged in conversation.  He stated several times how much he appreciated his life.  He appeared to respond positively to discussing his family.  He denied pain. 3. PATIENT/CAREGIVER EDUCATION/ COPING:  Patient copes by expressing his feelings openly.  He often uses humor. 4. PERSONAL EMERGENCY PLAN:  Patient informs his grandson with medical concerns.  He will then inform patient's daughters. 5. COMMUNITY RESOURCES COORDINATION/ HEALTH CARE NAVIGATION:  None. 6. FINANCIAL/LEGAL CONCERNS/INTERVENTIONS:  Patient is on a fixed income.     SOCIAL HX:  Social History   Tobacco Use  . Smoking status: Former Research scientist (life sciences)  . Smokeless tobacco: Never Used  Substance Use Topics  . Alcohol use: No    CODE STATUS:  DNR  ADVANCED DIRECTIVES: N MOST FORM COMPLETE:  N HOSPICE EDUCATION PROVIDED: N PPS:  Patient reports his appetite is normal.  He is able to stand and uses a walker to ambulate. Duration of visit and documentation:  60 minutes.     Creola Corn Allyana Vogan, LCSW

## 2018-02-19 NOTE — Progress Notes (Signed)
COMMUNITY PALLIATIVE CARE RN NOTE  PATIENT NAME: Brett Mora DOB: 02/28/1920 MRN: [id number]  PRIMARY CARE PROVIDER: Moore, Janece, FNP  RESPONSIBLE PARTY:  Acct ID - Guarantor Home Phone Work Phone Relationship Acct Type  [id number] - Brett Mora,Brett Mora [phone]  Self P/F     [address], [place], [place] [address]    PLAN OF CARE and INTERVENTION:  1. ADVANCE CARE PLANNING/GOALS OF CARE: Remain in his home with his grandson as caregiver and avoid going to the hospital 2. PATIENT/CAREGIVER EDUCATION: Reinforced Safe Mobility and Transfers 3. DISEASE STATUS: Joint visit made with Palliative Care SW, Lynn Duffy. Met with patient in his home with grandson present. Patient sitting up in his chair in the living room upon arrival. Remains very pleasant and conversational. He is alert and oriented x 3. He denies pain. Continues with a good appetite of both food and fluids. Denies dyshagia. Takes medications whole with water. Remains able to ambulate independently using his walker along with bathing, dressing and toileting. Grandson is nearby to offer assistance when needed. He has left-sided weakness from history of CVA, along with left hand contracture. Reinforced keeping a wash cloth in patient's hand, as his nails have rubbed a sore in the palm of his L hand. Grandson looking into someone to come out and clip patient's fingernails and toenails. Continues to wear R knee brace for stability. He recently had an appointment where labs were obtained. Grandson reports that he received a call a few days later to advise that patient's PSA was elevated and they were referring him to a Urologist. Grandson awaiting phone call to schedule appointment. Will continue to monitor.  HISTORY OF PRESENT ILLNESS:  This is a 84 yo male who resides in his home with his grandson as his caretaker. Palliative Care Team continues to follow patient. Next visit scheduled in 1 month.   CODE STATUS: DNR ADVANCED DIRECTIVES: N MOST  FORM: no PPS: 40%   PHYSICAL EXAM:   VITALS: Today's Vitals   02/19/18 1127  BP: 100/60  Pulse: (!) 59  Resp: 16  Temp: (!) 97.1 F (36.2 C)  TempSrc: Temporal  SpO2: 100%  PainSc: 0-No pain    LUNGS: clear to auscultation  CARDIAC: Cor RRR EXTREMITIES: No edema SKIN: Exposed skin dry and intact, small reddened area noted in palm of L hand   NEURO: Alert and oriented x 3, pleasant mood, generalized weakness, ambulatory with walker   (Duration of visit and documentation 75 minutes)     , RN, BSN 

## 2018-03-26 ENCOUNTER — Other Ambulatory Visit: Payer: Medicare Other | Admitting: Licensed Clinical Social Worker

## 2018-03-26 ENCOUNTER — Other Ambulatory Visit: Payer: Medicare Other | Admitting: *Deleted

## 2018-03-26 DIAGNOSIS — Z515 Encounter for palliative care: Secondary | ICD-10-CM

## 2018-03-27 NOTE — Progress Notes (Signed)
COMMUNITY PALLIATIVE CARE SW NOTE  PATIENT NAME: Brett Mora DOB: 1920-03-12 MRN: [id number]  PRIMARY CARE PROVIDER: Minette Brine, FNP  RESPONSIBLE PARTY:  Acct ID - Guarantor Home Phone Work Phone Relationship Acct Type  [id number] Brett, Mora [phone]  Self P/F     [address], [place], [place] [address]     PLAN OF CARE and INTERVENTIONS:             1. GOALS OF CARE/ ADVANCE CARE PLANNING:  Patient wishes to remain at home with his grandson.  He has a DNR. 2. SOCIAL/EMOTIONAL/SPIRITUAL ASSESSMENT/ INTERVENTIONS:  SW and Palliative Care RN, Brett Mora, met with patient and his grandson in patient's home.  Patient's grandson remains with him 24/7 and provides care.  Patient was alert and oriented.  He enjoys asking about weather and current events.  He denies pain.  He reports feeling "good" and does not spend his time worrying. 3. PATIENT/CAREGIVER EDUCATION/ COPING:  Continue reinforcing safety and self care. 4. PERSONAL EMERGENCY PLAN:  Patient will inform his grandson of any medical concerns, and he will contact patient's daughters. 5. COMMUNITY RESOURCES COORDINATION/ HEALTH CARE NAVIGATION:  None. 6. FINANCIAL/LEGAL CONCERNS/INTERVENTIONS:  Patient is on a fixed income.     SOCIAL HX:  Social History   Tobacco Use  . Smoking status: Former Research scientist (life sciences)  . Smokeless tobacco: Never Used  Substance Use Topics  . Alcohol use: No    CODE STATUS:  DNR  ADVANCED DIRECTIVES: N MOST FORM COMPLETE:  N HOSPICE EDUCATION PROVIDED: N PPS: Patient reports his appetite is normal.  He stands and ambulates with a walker. Duration of visit and documentation:  60 minutes.      Brett Corn Dearies Meikle, LCSW

## 2018-03-27 NOTE — Progress Notes (Signed)
COMMUNITY PALLIATIVE CARE RN NOTE  PATIENT NAME: Brett Mora DOB: 1919-11-17 MRN: [id number]  PRIMARY CARE PROVIDER: Minette Brine, FNP  RESPONSIBLE PARTY:  Acct ID - Guarantor Home Phone Work Phone Relationship Acct Type  [id number] Brett Mora, Brett Mora [phone]  Self P/F     [address], [place], [place] [address]    PLAN OF CARE and INTERVENTION:  1. ADVANCE CARE PLANNING/GOALS OF CARE: Remain in his home with grandson as caregiver 2. PATIENT/CAREGIVER EDUCATION: Reinforced Safe Mobility/Transfers 3. DISEASE STATUS: Joint visit made with Palliative Care SW, Lynn Duffy. Met with patient and his grandson at his home. He is awake and alert sitting up in his living room. Pleasant mood and engaging. Denies pain. Continues to wear R knee brace for stability and L wrist/hand brace d/t L hand contracture. He recently had his nails trimmed on his hands and feet in his home by a Nail Technician. He remains ambulatory with his rolling walker. He requires minimal assistance with dressing and getting out of the shower. His intake remains normal. No complaints this visit. Will continue to monitor.   HISTORY OF PRESENT ILLNESS: This is a 84 yo male who resides in his home. His grandson lives with him and provides 24/7 care. Palliative Care Team continues to follow patient. Next visit scheduled in 1 month.    CODE STATUS: DNR ADVANCED DIRECTIVES: N MOST FORM: no PPS: 40%   PHYSICAL EXAM:   VITALS: Today's Vitals   03/26/18 1158  BP: 116/62  Pulse: 64  Resp: 16  Temp: 97.6 F (36.4 C)  TempSrc: Temporal  SpO2: 98%  PainSc: 0-No pain    LUNGS: clear to auscultation  CARDIAC: Cor RRR EXTREMITIES: No edema SKIN: Exposed skin is dry and intact  NEURO: Alert and oriented x 3, HOH (has hearing aids in), L sided weakness, ambulatory with walker   (Duration of visit and documentation 75 minutes)    Daryl Eastern, RN, BSN

## 2018-04-23 ENCOUNTER — Telehealth: Payer: Self-pay

## 2018-04-23 ENCOUNTER — Other Ambulatory Visit: Payer: Medicare Other | Admitting: *Deleted

## 2018-04-23 DIAGNOSIS — Z515 Encounter for palliative care: Secondary | ICD-10-CM

## 2018-04-23 NOTE — Telephone Encounter (Signed)
Returned patient's grandson called to confirm his appointment for Wednesday.

## 2018-04-25 ENCOUNTER — Ambulatory Visit: Payer: Medicare Other | Admitting: Nurse Practitioner

## 2018-04-25 NOTE — Progress Notes (Signed)
COMMUNITY PALLIATIVE CARE RN NOTE  PATIENT NAME: Brett Mora DOB: 15-Aug-1919 MRN: [id number]  PRIMARY CARE PROVIDER: Minette Brine, FNP  RESPONSIBLE PARTY:  Acct ID - Guarantor Home Phone Work Phone Relationship Acct Type  [id number] Brett, Mora [phone]  Self P/F     [address], [place], [place] [address]    PLAN OF CARE and INTERVENTION:  1. ADVANCE CARE PLANNING/GOALS OF CARE: Remain in his home with grandson as caregiver and avoid going to the hospital 2. PATIENT/CAREGIVER EDUCATION: Reinforced Safe Mobility/Transfers 3. DISEASE STATUS: Met with patient in his home. Grandson present during visit. Patient is alert and oriented x 3, pleasant and conversational. He denies pain. Reports overall that he feels that he is doing well. He remains ambulatory with his walker and able to feed and toilet himself independently. He does require some assistance in the shower for safety and fall prevention. Grandson reports that about 2 days ago, patient experienced some R hand weakness, noted when patient dropped his plate of food all of a sudden. Unsure of cause. This has not happened again since then. He does have some tremors in his R hand, but grip is strong. Contracture noted to L hand. Wash cloth rolled up and placed in hand to prevent nails from digging into palm of his hand. Continues to wear a brace on R knee for stability. His intake remains good. Will continue to monitor.  HISTORY OF PRESENT ILLNESS:  This is a 84 yo male who resides in his home with his grandson as caregiver. Palliative Care Team continues to follow patient. Next visit scheduled in 1 month.  CODE STATUS: DNR  ADVANCED DIRECTIVES: N MOST FORM: no PPS: 50%   PHYSICAL EXAM:   VITALS: Today's Vitals   04/23/18 1044  BP: 118/62  Pulse: (!) 58  Resp: 16  Temp: (!) 97.4 F (36.3 C)  TempSrc: Temporal  SpO2: 98%  PainSc: 0-No pain    LUNGS: clear to auscultation  CARDIAC: Cor RRR EXTREMITIES: No edema SKIN:  Exposed skin is dry and intact  NEURO: Alert and oriented x 3, pleasant mood, L sided weakness from hx of CVA, ambulatory with walker   (Duration of visit and documentation 60 minutes)    Daryl Eastern, RN, BSN

## 2018-05-16 ENCOUNTER — Ambulatory Visit (INDEPENDENT_AMBULATORY_CARE_PROVIDER_SITE_OTHER): Payer: Medicare Other

## 2018-05-16 ENCOUNTER — Encounter: Payer: Self-pay | Admitting: Nurse Practitioner

## 2018-05-16 ENCOUNTER — Ambulatory Visit (INDEPENDENT_AMBULATORY_CARE_PROVIDER_SITE_OTHER): Payer: Medicare Other | Admitting: Nurse Practitioner

## 2018-05-16 VITALS — BP 150/78 | HR 73 | Temp 97.5°F

## 2018-05-16 VITALS — BP 150/78 | HR 73 | Temp 97.5°F | Ht 69.0 in | Wt 240.0 lb

## 2018-05-16 DIAGNOSIS — R269 Unspecified abnormalities of gait and mobility: Secondary | ICD-10-CM

## 2018-05-16 DIAGNOSIS — M545 Low back pain, unspecified: Secondary | ICD-10-CM

## 2018-05-16 DIAGNOSIS — R972 Elevated prostate specific antigen [PSA]: Secondary | ICD-10-CM | POA: Diagnosis not present

## 2018-05-16 DIAGNOSIS — D509 Iron deficiency anemia, unspecified: Secondary | ICD-10-CM | POA: Diagnosis not present

## 2018-05-16 DIAGNOSIS — Z0000 Encounter for general adult medical examination without abnormal findings: Secondary | ICD-10-CM

## 2018-05-16 DIAGNOSIS — I10 Essential (primary) hypertension: Secondary | ICD-10-CM | POA: Diagnosis not present

## 2018-05-16 DIAGNOSIS — I69354 Hemiplegia and hemiparesis following cerebral infarction affecting left non-dominant side: Secondary | ICD-10-CM

## 2018-05-16 DIAGNOSIS — E782 Mixed hyperlipidemia: Secondary | ICD-10-CM

## 2018-05-16 MED ORDER — DICLOFENAC SODIUM 1 % TD GEL: 2.0000 g | Freq: Four times a day (QID) | TRANSDERMAL | 3 refills | Status: AC

## 2018-05-16 NOTE — Progress Notes (Addendum)
Subjective:     Patient ID: Brett Mora , male    DOB: [birth year] , 85 y.o.   MRN: [id number]   Chief Complaint  Patient presents with  . Hypertension    HPI  Hypertension  This is a chronic problem. The current episode started more than 1 year ago. The problem is unchanged. The problem is controlled. Pertinent negatives include no anxiety, chest pain, headaches or palpitations. There are no associated agents to hypertension. Risk factors for coronary artery disease include obesity and sedentary lifestyle. There are no compliance problems.  There is no history of chronic renal disease.     Past Medical History:  Diagnosis Date  . Blood transfusion without reported diagnosis   . Hyperlipemia   . Hypertension   . Iron deficiency   . Stroke [facility])      No family history on file.   Current Outpatient Medications:  .  amLODipine (NORVASC) 5 MG tablet, Take 5 mg by mouth daily., Disp: , Rfl:  .  bicalutamide (CASODEX) 50 MG tablet, Take 50 mg by mouth daily., Disp: , Rfl:  .  Calcium Carbonate-Vitamin D (CALCIUM 600+D) 600-400 MG-UNIT per tablet, Take 1 tablet by mouth 3 (three) times daily with meals. , Disp: , Rfl:  .  Cholecalciferol (VITAMIN D PO), Take 1 tablet by mouth daily., Disp: , Rfl:  .  FERRO-SEQUELS 65-25 MG TBCR, Take 1 tablet by mouth daily., Disp: , Rfl: 1 .  hydrochlorothiazide (HYDRODIURIL) 25 MG tablet, Take 25 mg by mouth daily., Disp: , Rfl:  .  KLOR-CON 10 10 MEQ tablet, Take 10 mEq by mouth daily., Disp: , Rfl: 1 .  simvastatin (ZOCOR) 20 MG tablet, Take 20 mg by mouth daily., Disp: , Rfl:  .  traMADol (ULTRAM) 50 MG tablet, Take 1 tablet (50 mg total) by mouth every 6 (six) hours as needed. (Patient not taking: Reported on 05/16/2018), Disp: 15 tablet, Rfl: 0 .  valsartan (DIOVAN) 160 MG tablet, Take 160 mg by mouth daily., Disp: , Rfl:    No Known Allergies   Review of Systems  Constitutional: Negative.   Respiratory: Negative.   Cardiovascular: Negative.   Negative for chest pain, palpitations and leg swelling.  Neurological: Negative for dizziness and headaches.  Psychiatric/Behavioral: Negative.      Today's Vitals   05/16/18 1559  BP: (!) 150/78  Pulse: 73  Temp: (!) 97.5 F (36.4 C)  TempSrc: Oral  SpO2: 95%  Weight: 240 lb (108.9 kg)  Height: 5\' 9"  (1.753 m)   Body mass index is 35.44 kg/m.   Objective:  Physical Exam Constitutional:      Appearance: Normal appearance.  Cardiovascular:     Rate and Rhythm: Normal rate and regular rhythm.     Pulses: Normal pulses.     Heart sounds: Normal heart sounds. No murmur.  Pulmonary:     Effort: Pulmonary effort is normal. No respiratory distress.     Breath sounds: Normal breath sounds.  Musculoskeletal:        General: Tenderness present. No swelling.  Skin:    General: Skin is warm and dry.     Capillary Refill: Capillary refill takes less than 2 seconds.  Neurological:     General: No focal deficit present.     Mental Status: He is alert and oriented to person, place, and time.  Psychiatric:        Mood and Affect: Mood normal.         Assessment And  Plan:     1. Acute bilateral low back pain without sciatica  Will try diclofenac gel pending insurance approval  Encouraged to take tylenol twice a day scheduled - diclofenac sodium (VOLTAREN) 1 % GEL; Apply 2 g topically 4 (four) times daily.  Dispense: 1 Tube; Refill: 3  2. Essential hypertension . B/P is controlled.  . CMP ordered to check renal function.  . The importance of regular exercise however he is limited due to his chronic joint pain and dietary modification was stressed to the patient.  - Comprehensive metabolic panel - EKG [id number]  3. Mixed hyperlipidemia  Chronic, controlled  Continue with current medications - Lipid panel  4. Elevated prostate specific antigen (PSA)  Was elevated at last visit and has not followed up with urology  Will recheck PSA levels and likely refer to  urologist  Denies any urinary symptoms at this time - PSA  5. Iron deficiency anemia, unspecified iron deficiency anemia type  Chronic  Continue with iron supplements  Will check CBC with diff and add iron studies as necessary - CBC with Diff - Ambulatory referral to Urology  7. Hemiparesis affecting left side as late effect of cerebrovascular accident [facility])  Addendum: Stable, has left sided weakness.  Patient requires a hospital bed due to limited mobility and is unable to achieve repositioning with a regular bed due to this.  Requires frequent repositioning in bed   I will order a gel overlay.    8. Abnormality of gait  Addendum: Requires the use of a cane or walker for ambulation  Also requires supervision at this time  Has limited mobility and ability to sit up on his own in a regular bed, the use of the elevated head will assist with this task.     Minette Brine, FNP

## 2018-05-16 NOTE — Patient Instructions (Signed)
Brett Mora , Thank you for taking time to come for your Medicare Wellness Visit. I appreciate your ongoing commitment to your health goals. Please review the following plan we discussed and let me know if I can assist you in the future.   Screening recommendations/referrals: Colonoscopy: not required Recommended yearly ophthalmology/optometry visit for glaucoma screening and checkup Recommended yearly dental visit for hygiene and checkup  Vaccinations: Influenza vaccine: unknown Pneumococcal vaccine: decline Tdap vaccine: 05/2013 Shingles vaccine: decline  Advanced directives: Advance directive discussed with you today. Even though you declined this today please call our office should you change your mind and we can give you the proper paperwork for you to fill out.   Conditions/risks identified: Hard of hearing, but does not wear hearing aides  Next appointment: 05/16/2018  Preventive Care 65 Years and Older, Male Preventive care refers to lifestyle choices and visits with your health care provider that can promote health and wellness. What does preventive care include?  A yearly physical exam. This is also called an annual well check.  Dental exams once or twice a year.  Routine eye exams. Ask your health care provider how often you should have your eyes checked.  Personal lifestyle choices, including:  Daily care of your teeth and gums.  Regular physical activity.  Eating a healthy diet.  Avoiding tobacco and drug use.  Limiting alcohol use.  Practicing safe sex.  Taking low doses of aspirin every day.  Taking vitamin and mineral supplements as recommended by your health care provider. What happens during an annual well check? The services and screenings done by your health care provider during your annual well check will depend on your age, overall health, lifestyle risk factors, and family history of disease. Counseling  Your health care provider may ask you  questions about your:  Alcohol use.  Tobacco use.  Drug use.  Emotional well-being.  Home and relationship well-being.  Sexual activity.  Eating habits.  History of falls.  Memory and ability to understand (cognition).  Work and work Statistician. Screening  You may have the following tests or measurements:  Height, weight, and BMI.  Blood pressure.  Lipid and cholesterol levels. These may be checked every 5 years, or more frequently if you are over 36 years old.  Skin check.  Lung cancer screening. You may have this screening every year starting at age 31 if you have a 30-pack-year history of smoking and currently smoke or have quit within the past 15 years.  Fecal occult blood test (FOBT) of the stool. You may have this test every year starting at age 31.  Flexible sigmoidoscopy or colonoscopy. You may have a sigmoidoscopy every 5 years or a colonoscopy every 10 years starting at age 33.  Prostate cancer screening. Recommendations will vary depending on your family history and other risks.  Hepatitis C blood test.  Hepatitis B blood test.  Sexually transmitted disease (STD) testing.  Diabetes screening. This is done by checking your blood sugar (glucose) after you have not eaten for a while (fasting). You may have this done every 1-3 years.  Abdominal aortic aneurysm (AAA) screening. You may need this if you are a current or former smoker.  Osteoporosis. You may be screened starting at age 28 if you are at high risk. Talk with your health care provider about your test results, treatment options, and if necessary, the need for more tests. Vaccines  Your health care provider may recommend certain vaccines, such as:  Influenza vaccine.  This is recommended every year.  Tetanus, diphtheria, and acellular pertussis (Tdap, Td) vaccine. You may need a Td booster every 10 years.  Zoster vaccine. You may need this after age 33.  Pneumococcal 13-valent conjugate  (PCV13) vaccine. One dose is recommended after age 35.  Pneumococcal polysaccharide (PPSV23) vaccine. One dose is recommended after age 33. Talk to your health care provider about which screenings and vaccines you need and how often you need them. This information is not intended to replace advice given to you by your health care provider. Make sure you discuss any questions you have with your health care provider. Document Released: 05/22/2015 Document Revised: 01/13/2016 Document Reviewed: 02/24/2015 Elsevier Interactive Patient Education  2017 [employer] Prevention in the Home Falls can cause injuries. They can happen to people of all ages. There are many things you can do to make your home safe and to help prevent falls. What can I do on the outside of my home?  Regularly fix the edges of walkways and driveways and fix any cracks.  Remove anything that might make you trip as you walk through a door, such as a raised step or threshold.  Trim any bushes or trees on the path to your home.  Use bright outdoor lighting.  Clear any walking paths of anything that might make someone trip, such as rocks or tools.  Regularly check to see if handrails are loose or broken. Make sure that both sides of any steps have handrails.  Any raised decks and porches should have guardrails on the edges.  Have any leaves, snow, or ice cleared regularly.  Use sand or salt on walking paths during winter.  Clean up any spills in your garage right away. This includes oil or grease spills. What can I do in the bathroom?  Use night lights.  Install grab bars by the toilet and in the tub and shower. Do not use towel bars as grab bars.  Use non-skid mats or decals in the tub or shower.  If you need to sit down in the shower, use a plastic, non-slip stool.  Keep the floor dry. Clean up any water that spills on the floor as soon as it happens.  Remove soap buildup in the tub or shower  regularly.  Attach bath mats securely with double-sided non-slip rug tape.  Do not have throw rugs and other things on the floor that can make you trip. What can I do in the bedroom?  Use night lights.  Make sure that you have a light by your bed that is easy to reach.  Do not use any sheets or blankets that are too big for your bed. They should not hang down onto the floor.  Have a firm chair that has side arms. You can use this for support while you get dressed.  Do not have throw rugs and other things on the floor that can make you trip. What can I do in the kitchen?  Clean up any spills right away.  Avoid walking on wet floors.  Keep items that you use a lot in easy-to-reach places.  If you need to reach something above you, use a strong step stool that has a grab bar.  Keep electrical cords out of the way.  Do not use floor polish or wax that makes floors slippery. If you must use wax, use non-skid floor wax.  Do not have throw rugs and other things on the floor that can make  you trip. What can I do with my stairs?  Do not leave any items on the stairs.  Make sure that there are handrails on both sides of the stairs and use them. Fix handrails that are broken or loose. Make sure that handrails are as long as the stairways.  Check any carpeting to make sure that it is firmly attached to the stairs. Fix any carpet that is loose or worn.  Avoid having throw rugs at the top or bottom of the stairs. If you do have throw rugs, attach them to the floor with carpet tape.  Make sure that you have a light switch at the top of the stairs and the bottom of the stairs. If you do not have them, ask someone to add them for you. What else can I do to help prevent falls?  Wear shoes that:  Do not have high heels.  Have rubber bottoms.  Are comfortable and fit you well.  Are closed at the toe. Do not wear sandals.  If you use a stepladder:  Make sure that it is fully  opened. Do not climb a closed stepladder.  Make sure that both sides of the stepladder are locked into place.  Ask someone to hold it for you, if possible.  Clearly mark and make sure that you can see:  Any grab bars or handrails.  First and last steps.  Where the edge of each step is.  Use tools that help you move around (mobility aids) if they are needed. These include:  Canes.  Walkers.  Scooters.  Crutches.  Turn on the lights when you go into a dark area. Replace any light bulbs as soon as they burn out.  Set up your furniture so you have a clear path. Avoid moving your furniture around.  If any of your floors are uneven, fix them.  If there are any pets around you, be aware of where they are.  Review your medicines with your doctor. Some medicines can make you feel dizzy. This can increase your chance of falling. Ask your doctor what other things that you can do to help prevent falls. This information is not intended to replace advice given to you by your health care provider. Make sure you discuss any questions you have with your health care provider. Document Released: 02/19/2009 Document Revised: 10/01/2015 Document Reviewed: 05/30/2014 Elsevier Interactive Patient Education  2017 [employer].

## 2018-05-16 NOTE — Progress Notes (Signed)
Subjective:   Brett Mora is a 85 y.o. male who presents for Medicare Annual/Subsequent preventive examination.  Review of Systems:  n/a Cardiac Risk Factors include: advanced age (>41men, >43 women);dyslipidemia;hypertension;sedentary lifestyle;male gender     Objective:    Vitals: BP (!) 150/78 (BP Location: Right Arm, Patient Position: Sitting)   Pulse 73   Temp (!) 97.5 F (36.4 C) (Oral)   SpO2 95%   There is no height or weight on file to calculate BMI.  Advanced Directives 05/16/2018 01/25/2017 07/11/2013  Does Patient Have a Medical Advance Directive? No Yes Patient does not have advance directive  Type of Advance Directive - Out of facility DNR (pink MOST or yellow form) -  Would patient like information on creating a medical advance directive? No - Patient declined - -    Tobacco Social History   Tobacco Use  Smoking Status Former Smoker  Smokeless Tobacco Never Used     Counseling given: Not Answered   Clinical Intake:  Pre-visit preparation completed: Yes  Pain : No/denies pain     Nutritional Risks: None Diabetes: No  How often do you need to have someone help you when you read instructions, pamphlets, or other written materials from your doctor or pharmacy?: 3 - Sometimes What is the last grade level you completed in school?: 12th grade  Interpreter Needed?: No  Information entered by :: NAllen LPN  Past Medical History:  Diagnosis Date  . Blood transfusion without reported diagnosis   . Hyperlipemia   . Hypertension   . Iron deficiency   . Stroke [facility])    Past Surgical History:  Procedure Laterality Date  . BACK SURGERY     History reviewed. No pertinent family history. Social History   Socioeconomic History  . Marital status: Widowed    Spouse name: Not on file  . Number of children: Not on file  . Years of education: Not on file  . Highest education level: Not on file  Occupational History  . Occupation: retired  Scientific laboratory technician  .  Financial resource strain: Not hard at all  . Food insecurity:    Worry: Never true    Inability: Never true  . Transportation needs:    Medical: No    Non-medical: No  Tobacco Use  . Smoking status: Former Research scientist (life sciences)  . Smokeless tobacco: Never Used  Substance and Sexual Activity  . Alcohol use: No  . Drug use: No  . Sexual activity: Not Currently  Lifestyle  . Physical activity:    Days per week: 7 days    Minutes per session: 10 min  . Stress: Not at all  Relationships  . Social connections:    Talks on phone: Not on file    Gets together: Not on file    Attends religious service: Not on file    Active member of club or organization: Not on file    Attends meetings of clubs or organizations: Not on file    Relationship status: Not on file  Other Topics Concern  . Not on file  Social History Narrative  . Not on file    Outpatient Encounter Medications as of 05/16/2018  Medication Sig  . amLODipine (NORVASC) 5 MG tablet Take 5 mg by mouth daily.  . Calcium Carbonate-Vitamin D (CALCIUM 600+D) 600-400 MG-UNIT per tablet Take 1 tablet by mouth 3 (three) times daily with meals.   . Cholecalciferol (VITAMIN D PO) Take 1 tablet by mouth daily.  Marland Kitchen FERRO-SEQUELS 65-25  MG TBCR Take 1 tablet by mouth daily.  . hydrochlorothiazide (HYDRODIURIL) 25 MG tablet Take 25 mg by mouth daily.  Marland Kitchen KLOR-CON 10 10 MEQ tablet Take 10 mEq by mouth daily.  . simvastatin (ZOCOR) 20 MG tablet Take 20 mg by mouth daily.  . bicalutamide (CASODEX) 50 MG tablet Take 50 mg by mouth daily.  . traMADol (ULTRAM) 50 MG tablet Take 1 tablet (50 mg total) by mouth every 6 (six) hours as needed. (Patient not taking: Reported on 05/16/2018)  . valsartan (DIOVAN) 160 MG tablet Take 160 mg by mouth daily.   No facility-administered encounter medications on file as of 05/16/2018.     Activities of Daily Living In your present state of health, do you have any difficulty performing the following activities: 05/16/2018    Hearing? Y  Comment does not wear his hearing aides regularly  Vision? Y  Comment states needs new glasses  Difficulty concentrating or making decisions? N  Walking or climbing stairs? N  Comment does not use stairs  Dressing or bathing? N  Doing errands, shopping? Y  Preparing Food and eating ? Y  Comment grandson does  Using the Toilet? N  In the past six months, have you accidently leaked urine? Y  Comment gets to bathroom late at times  Do you have problems with loss of bowel control? N  Managing your Medications? N  Managing your Finances? N  Housekeeping or managing your Housekeeping? N  Some recent data might be hidden    Patient Care Team: Minette Brine, FNP as PCP - General ([place]) Conan Bowens, RN as Registered Nurse [facility] Pall[facility])   Assessment:   This is a routine wellness examination for Brett Mora.  Exercise Activities and Dietary recommendations Current Exercise Habits: The patient does not participate in regular exercise at present, Exercise limited by: orthopedic condition(s)  Goals    . Patient Stated (pt-stated)     To do what ever he feels like       Fall Risk Fall Risk  05/16/2018  Falls in the past year? 1  Number falls in past yr: 0  Comment trying to get up with slippers  Injury with Fall? 0  Risk for fall due to : Impaired mobility  Follow up Falls prevention discussed   Is the patient's home free of loose throw rugs in walkways, pet beds, electrical cords, etc?   yes      Grab bars in the bathroom? yes      Handrails on the stairs?   n/a      Adequate lighting?   yes  Timed Get Up and Go Performed: n/a  Depression Screen PHQ 2/9 Scores 05/16/2018  PHQ - 2 Score 0    Cognitive Function        Immunization History  Administered Date(s) Administered  . Tdap 09/02/2012    Qualifies for Shingles Vaccine? yes  Screening Tests Health Maintenance  Topic Date Due  . PNA vac Low Risk Adult (1 of 2 -  PCV13) 09/11/1986  . INFLUENZA VACCINE  12/07/2017  . TETANUS/TDAP  09/03/2022   Cancer Screenings: Lung: Low Dose CT Chest recommended if Age 68-80 years, 30 pack-year currently smoking OR have quit w/in 15years. Patient does not qualify. Colorectal: not required  Additional Screenings:  Hepatitis C Screening:n/a      Plan:    Patient refused to complete the rest of the 6 CIT evaluation.  Patient refused to stand for height and weight.  He stated that his back had been hurting him although he has no pain at this time. I have personally reviewed and noted the following in the patient's chart:   . Medical and social history . Use of alcohol, tobacco or illicit drugs  . Current medications and supplements . Functional ability and status . Nutritional status . Physical activity . Advanced directives . List of other physicians . Hospitalizations, surgeries, and ER visits in previous 12 months . Vitals . Screenings to include cognitive, depression, and falls . Referrals and appointments  In addition, I have reviewed and discussed with patient certain preventive protocols, quality metrics, and best practice recommendations. A written personalized care plan for preventive services as well as general preventive health recommendations were provided to patient.     Kellie Simmering, LPN  06/16/9020

## 2018-05-16 NOTE — Patient Instructions (Signed)

## 2018-05-17 ENCOUNTER — Encounter: Payer: Self-pay | Admitting: Nurse Practitioner

## 2018-05-17 LAB — CBC WITH DIFFERENTIAL/PLATELET
BASOS ABS: 0 10*3/uL (ref 0.0–0.2)
Basos: 1 %
EOS (ABSOLUTE): 0.1 10*3/uL (ref 0.0–0.4)
Eos: 3 %
HEMOGLOBIN: 12.9 g/dL — AB (ref 13.0–17.7)
Hematocrit: 39.7 % (ref 37.5–51.0)
IMMATURE GRANS (ABS): 0 10*3/uL (ref 0.0–0.1)
Immature Granulocytes: 0 %
LYMPHS: 35 %
Lymphocytes Absolute: 1.4 10*3/uL (ref 0.7–3.1)
MCH: 27 pg (ref 26.6–33.0)
MCHC: 32.5 g/dL (ref 31.5–35.7)
MCV: 83 fL (ref 79–97)
MONOCYTES: 9 %
Monocytes Absolute: 0.4 10*3/uL (ref 0.1–0.9)
Neutrophils Absolute: 2.2 10*3/uL (ref 1.4–7.0)
Neutrophils: 52 %
PLATELETS: 205 10*3/uL (ref 150–450)
RBC: 4.77 x10E6/uL (ref 4.14–5.80)
RDW: 14.3 % (ref 11.6–15.4)
WBC: 4.1 10*3/uL (ref 3.4–10.8)

## 2018-05-17 LAB — LIPID PANEL
Chol/HDL Ratio: 3.7 ratio (ref 0.0–5.0)
Cholesterol, Total: 149 mg/dL (ref 100–199)
HDL: 40 mg/dL (ref >39)
LDL CALC: 80 mg/dL (ref 0–99)
Triglycerides: 143 mg/dL (ref 0–149)
VLDL CHOLESTEROL CAL: 29 mg/dL (ref 5–40)

## 2018-05-17 LAB — COMPREHENSIVE METABOLIC PANEL
ALBUMIN: 4 g/dL (ref 3.2–4.6)
ALK PHOS: 53 IU/L (ref 39–117)
ALT: 9 IU/L (ref 0–44)
AST: 20 IU/L (ref 0–40)
Albumin/Globulin Ratio: 1.3 (ref 1.2–2.2)
BUN/Creatinine Ratio: 13 (ref 10–24)
BUN: 14 mg/dL (ref 10–36)
Bilirubin Total: 0.4 mg/dL (ref 0.0–1.2)
CHLORIDE: 102 mmol/L (ref 96–106)
CO2: 26 mmol/L (ref 20–29)
CREATININE: 1.12 mg/dL (ref 0.76–1.27)
Calcium: 9.7 mg/dL (ref 8.6–10.2)
GFR calc Af Amer: 64 mL/min/{1.73_m2} (ref >59)
GFR calc non Af Amer: 55 mL/min/{1.73_m2} — ABNORMAL LOW (ref >59)
Globulin, Total: 3.1 g/dL (ref 1.5–4.5)
Glucose: 102 mg/dL — ABNORMAL HIGH (ref 65–99)
Potassium: 3.8 mmol/L (ref 3.5–5.2)
Sodium: 143 mmol/L (ref 134–144)
Total Protein: 7.1 g/dL (ref 6.0–8.5)

## 2018-05-17 LAB — PSA: PROSTATE SPECIFIC AG, SERUM: 80.2 ng/mL — AB (ref 0.0–4.0)

## 2018-05-18 ENCOUNTER — Ambulatory Visit: Payer: Medicare Other | Admitting: Nurse Practitioner

## 2018-05-21 ENCOUNTER — Other Ambulatory Visit: Payer: Self-pay | Admitting: Nurse Practitioner

## 2018-06-04 ENCOUNTER — Other Ambulatory Visit: Payer: Medicare Other | Admitting: Licensed Clinical Social Worker

## 2018-06-04 ENCOUNTER — Other Ambulatory Visit: Payer: Medicare Other | Admitting: *Deleted

## 2018-06-04 DIAGNOSIS — Z515 Encounter for palliative care: Secondary | ICD-10-CM

## 2018-06-06 NOTE — Progress Notes (Signed)
COMMUNITY PALLIATIVE CARE SW NOTE  PATIENT NAME: Brett Mora DOB: 05/14/1919 MRN: [id number]  PRIMARY CARE PROVIDER: Minette Brine, FNP  RESPONSIBLE PARTY:  Acct ID - Guarantor Home Phone Work Phone Relationship Acct Type  [id number] Brett Mora, Brett Mora [phone]  Self P/F     [address], [place], [place] [address]     PLAN OF CARE and INTERVENTIONS:             1. GOALS OF CARE/ ADVANCE CARE PLANNING:  Patient's goal is to remain at home with his grandson.  Patient has a DNR. 2. SOCIAL/EMOTIONAL/SPIRITUAL ASSESSMENT/ INTERVENTIONS:  SW and Palliative Care RN, Daryl Eastern, met with patient and his grandson in their home.  Patient was sitting in his recliner.  He expressed periodic discomfort.  Patient was alert and oriented.  He appeared to respond positively to discussing his family members.  SW provided active listening and supportive counseling. 3. PATIENT/CAREGIVER EDUCATION/ COPING:  Patient copes by expressing his feelings openly.  He also uses humor. 4. PERSONAL EMERGENCY PLAN:  Patient will rest when he becomes fatigued.  Family will contact EMS if medical emergency. 5. COMMUNITY RESOURCES COORDINATION/ HEALTH CARE NAVIGATION:  None. 6. FINANCIAL/LEGAL CONCERNS/INTERVENTIONS:  None.     SOCIAL HX:  Social History   Tobacco Use  . Smoking status: Former Research scientist (life sciences)  . Smokeless tobacco: Never Used  Substance Use Topics  . Alcohol use: No    CODE STATUS:  DNR  ADVANCED DIRECTIVES: N MOST FORM COMPLETE:  N HOSPICE EDUCATION PROVIDED: N PPS: Patient's appetite is normal.  He is Brett Mora to stand and ambulates with his walker. Duration of visit and documentation:  60 minutes. Plan:  Continue assessing patient's coping.  Next visit scheduled in 1 month.      Brett Corn Vitor Overbaugh, LCSW

## 2018-06-07 NOTE — Progress Notes (Signed)
COMMUNITY PALLIATIVE CARE RN NOTE  PATIENT NAME: Brett Mora DOB: 1920-04-12 MRN: [id number]  PRIMARY CARE PROVIDER: Minette Brine, FNP  RESPONSIBLE PARTY:  Acct ID - Guarantor Home Phone Work Phone Relationship Acct Type  [id number] [name], [name] [phone]  Self P/F     [address], [place], [place] [address]    PLAN OF CARE and INTERVENTION:  1. ADVANCE CARE PLANNING/GOALS OF CARE: He wants to remain in his home for as long as possible with grandson as caregiver.  2. PATIENT/CAREGIVER EDUCATION: Reinforced Safe Mobility and Pain Management 3. DISEASE STATUS: Joint visit made with Palliative Care SW, Lynn Duffy. Met with patient and his grandson in their home. He is awake and alert, sitting up in his chair in the living room. Pleasant and engaging. He reports that he has been experiencing pain in his lower back recently, mainly with movement. Denies pain at rest. Has started taking Extra Strength Tylenol to help, which has been effective. He had an appointment earlier this month with his PCP. Voltaren gel was ordered 4 times per day to help as well. Labs were ordered and drawn. Grandson reports that they were also told to increase fiber in patient's diet, as he has been experiencing intermittent constipation. Continues to wear L wrist brace for contracture and R knee brace for better stability. He remains able to ambulate using his walker. Requires minimal assistance with bathing/dressing.  Intake remains good. Will continue to monitor.  HISTORY OF PRESENT ILLNESS:  This is a 85 yo male who resides in his home with grandson as caregiver. Palliative Care Team continues to follow patient. Next visit scheduled in 1 month.   CODE STATUS: DNR ADVANCED DIRECTIVES: N MOST FORM: no PPS: 50%   PHYSICAL EXAM:   VITALS: Today's Vitals   06/04/18 1144  BP: 122/60  Pulse: 71  Resp: 16  Temp: 97.9 F (36.6 C)  TempSrc: Temporal  SpO2: 94%  PainSc: 0-No pain    LUNGS: clear to  auscultation  CARDIAC: Cor RRR EXTREMITIES: No edema SKIN: Extra dry skin noted to bilateral arms, grandson using [employer] Intense Therapy lotion to help  NEURO: Alert and oriented x 3, HOH, wears hearing aids, ambulatory with walker   (Duration of visit and documentation 60 minutes)    Daryl Eastern, RN, BSN

## 2018-06-20 DIAGNOSIS — R972 Elevated prostate specific antigen [PSA]: Secondary | ICD-10-CM | POA: Diagnosis not present

## 2018-06-20 DIAGNOSIS — C61 Malignant neoplasm of prostate: Secondary | ICD-10-CM | POA: Diagnosis not present

## 2018-07-02 ENCOUNTER — Other Ambulatory Visit: Payer: Medicare Other | Admitting: Licensed Clinical Social Worker

## 2018-07-02 ENCOUNTER — Other Ambulatory Visit: Payer: Medicare Other | Admitting: *Deleted

## 2018-07-02 DIAGNOSIS — Z515 Encounter for palliative care: Secondary | ICD-10-CM

## 2018-07-02 NOTE — Progress Notes (Signed)
COMMUNITY PALLIATIVE CARE SW NOTE  PATIENT NAME: Brett Mora DOB: 1920/05/07 MRN: [id number]  PRIMARY CARE PROVIDER: Minette Brine, FNP  RESPONSIBLE PARTY:  Acct ID - Guarantor Home Phone Work Phone Relationship Acct Type  [id number] Brett Mora [phone]  Self P/F     [address], [place], [place] [address]     PLAN OF CARE and INTERVENTIONS:             1. GOALS OF CARE/ ADVANCE CARE PLANNING:  Goal is for patient to remain at home with his grandson.  He is a DNR. 2. SOCIAL/EMOTIONAL/SPIRITUAL ASSESSMENT/ INTERVENTIONS:  SW and Palliative Care RN, Daryl Eastern, met with patient and his grandson.  Patient was alert and oriented.  He engaged in life review and appeared to respond positively to social interaction.  He reports having no worries and is prepared to die.  Patient expressed concern for his grandson when he dies, as grandson has been living in his home and caring for him for six years. 3. PATIENT/CAREGIVER EDUCATION/ COPING:  Patient copes by expressing his feelings openly and uses humor. 4. PERSONAL EMERGENCY PLAN:  Patient will rest when fatigued.  His grandson will contact EMS if needed. 5. COMMUNITY RESOURCES COORDINATION/ HEALTH CARE NAVIGATION:  None 6. FINANCIAL/LEGAL CONCERNS/INTERVENTIONS:  Patient is on a fixed income.     SOCIAL HX:  Social History   Tobacco Use  . Smoking status: Former Research scientist (life sciences)  . Smokeless tobacco: Never Used  Substance Use Topics  . Alcohol use: No    CODE STATUS:  DNR  ADVANCED DIRECTIVES: N MOST FORM COMPLETE:  N HOSPICE EDUCATION PROVIDED:  N PPS:  Patient's appetite is normal.  He uses a walker to ambulate. Duration of visit and documentation:  60 minutes.      Brett Corn Ottie Tillery, LCSW

## 2018-07-02 NOTE — Progress Notes (Signed)
PATIENT NAME: Brett Mora DOB: [birth year] MRN: [id number]  PRIMARY CARE PROVIDER: Minette Brine, FNP  RESPONSIBLE PARTY:  Acct ID - Guarantor Home Phone Work Phone Relationship Acct Type  [id number] ABDULKARIM, EBERLIN [phone]  Self P/F     [address], [place], [place] [address]    PLAN OF CARE and INTERVENTION:  1. ADVANCE CARE PLANNING/GOALS OF CARE: He wants to remain at home with his grandson as caretaker. He is a DNR. 2. PATIENT/CAREGIVER EDUCATION: Reinforced Safe Mobility/Transfers 3. DISEASE STATUS: Joint visit made with Palliative Care SW, Lynn Duffy. Patient sitting up in chair in the living room awake and alert. Pleasant mood and engaging. He denies pain while at rest, but reports mild pain in R knee with standing and ambulation. He continues to wear a knee brace for stability. He remains able to ambulate using a walker. Requires standby assistance during showers for safety, but is able to shower, dress, toilet and feed himself. He has grab bars in the bathroom along with an elevated toilet seat and hospital bed. He has a good appetite. Denies dysphagia. He is continent of both bowel and bladder. Continues with contracture to L hand. Wash cloth kept in L hand to help along with L wrist brace. No edema. He recently had an appointment with his Urologist. [name] reports PSA was elevated at 81 and goal is 0-4. He received an injection during visit to help lower this. He has no complaints or concerns at this time. Will continue to monitor.  HISTORY OF PRESENT ILLNESS:  This is a 86 yo male who resides in his home with grandson as caregiver. Palliative Care Team continues to follow patient. Next visit scheduled in 1 month.  CODE STATUS: DNR  ADVANCED DIRECTIVES: N MOST FORM: no PPS: 50%   PHYSICAL EXAM:   VITALS: Today's Vitals   07/02/18 1126  BP: 126/72  Pulse: (!) 53  Resp: 18  Temp: (!) 97 F (36.1 C)  TempSrc: Temporal  SpO2: 100%  PainSc: 0-No pain    LUNGS: clear to  auscultation  CARDIAC: Cor Loletha Grayer EXTREMITIES: No edema SKIN: Exposed skin is dry and intact  NEURO: Alert and oriented x 3, slight word finding difficulties at times, ambulatory with walker   (Duration of visit and documentation 60 minutes)    Daryl Eastern, RN, BSN

## 2018-07-10 ENCOUNTER — Telehealth: Payer: Self-pay

## 2018-07-10 NOTE — Telephone Encounter (Signed)
Pt 's daughter called and wanted  A new hospital mattress and need a rx to be sent to [facility] [phone].

## 2018-07-23 ENCOUNTER — Other Ambulatory Visit: Payer: Self-pay

## 2018-07-23 ENCOUNTER — Telehealth: Payer: Self-pay | Admitting: Nurse Practitioner

## 2018-07-23 ENCOUNTER — Other Ambulatory Visit: Payer: Medicare Other | Admitting: *Deleted

## 2018-07-23 ENCOUNTER — Other Ambulatory Visit: Payer: Medicare Other | Admitting: Licensed Clinical Social Worker

## 2018-07-23 DIAGNOSIS — R972 Elevated prostate specific antigen [PSA]: Secondary | ICD-10-CM | POA: Insufficient documentation

## 2018-07-23 DIAGNOSIS — I69354 Hemiplegia and hemiparesis following cerebral infarction affecting left non-dominant side: Secondary | ICD-10-CM | POA: Insufficient documentation

## 2018-07-23 DIAGNOSIS — D509 Iron deficiency anemia, unspecified: Secondary | ICD-10-CM | POA: Insufficient documentation

## 2018-07-23 DIAGNOSIS — R269 Unspecified abnormalities of gait and mobility: Secondary | ICD-10-CM | POA: Insufficient documentation

## 2018-07-23 DIAGNOSIS — Z515 Encounter for palliative care: Secondary | ICD-10-CM

## 2018-07-23 NOTE — Progress Notes (Signed)
COMMUNITY PALLIATIVE CARE RN NOTE  PATIENT NAME: Brett Mora DOB: [birth year] MRN: [id number]  PRIMARY CARE PROVIDER: Minette Brine, FNP  RESPONSIBLE PARTY:  Acct ID - Guarantor Home Phone Work Phone Relationship Acct Type  [id number] Brett Mora, Brett Mora [phone]  Self P/F     [address], [place], [place] [address]    PLAN OF CARE and INTERVENTION:  1. ADVANCE CARE PLANNING/GOALS OF CARE: Goal is to remain in his home for as long as possible. He is a DNR. 2. PATIENT/CAREGIVER EDUCATION: Reinforced Safe Mobility and Pain Management 3. DISEASE STATUS: Joint visit made with Palliative Care SW, Lynn Duffy. Met with patient and grandson in their home. Patient is sitting up in his chair in living room awake and alert. He is very pleasant and engaging. He is HOH so have to repeat statements/questions at times. He does wear hearing aids to help. He reports mild pain in his R knee, which is usually present when bearing weight. He does take PRN Tylenol to help which is effective in minimizing pain. He continues to wear a R knee brace. Also wears a brace to L wrist and L hand d/t contracture. He does experience L sided weakness from h/o CVA. He remains ambulatory using his walker. He is performing ROM exercises with both legs while sitting throughout visit to prevent legs from becoming stiff. His grandson continues to assist patient with dressing. Patient is able to shower and toilet himself, but grandson is nearby in case he needs assistance for safety purposes. His intake remains normal. He is having regular BMs. No new issues or concerns. Will continue to monitor.   HISTORY OF PRESENT ILLNESS:  This is a 85 yo male who resides at home with his grandson as caretaker. Palliative Care team continues to follow patient. Next visit scheduled in 1 month.  CODE STATUS: DNR ADVANCED DIRECTIVES: N MOST FORM: N PPS: 50%   PHYSICAL EXAM:   LUNGS: No dyspnea CARDIAC: Cor RRR EXTREMITIES: No edema SKIN:  Exposed skin is dry and intact; no open areas reported or observed  NEURO: Alert and oriented x 3, pleasant mood, HOH, ambulatory with walker    (Duration of visit and documentation 60 minutes)   Daryl Eastern, RN BSN

## 2018-07-23 NOTE — Progress Notes (Signed)
COMMUNITY PALLIATIVE CARE SW NOTE  PATIENT NAME: Brett Mora DOB: 12/12/1919 MRN: [id number]  PRIMARY CARE PROVIDER: Moore, Janece, FNP  RESPONSIBLE PARTY:  Acct ID - Guarantor Home Phone Work Phone Relationship Acct Type  [id number] - Michelotti,Orlander [phone]  Self P/F     [address], [place], [place] [address]     PLAN OF CARE and INTERVENTIONS:             1. GOALS OF CARE/ ADVANCE CARE PLANNING:  Patient's goal is to remain in his home with his grandson.  Patient has a DNR. 2. SOCIAL/EMOTIONAL/SPIRITUAL ASSESSMENT/ INTERVENTIONS:  SW and Palliative Care RN, Monishia Howard, met with patient and his grandson in their home.  Patient was sitting in his usual recliner.  He was able to display his exercise of leg lifts.  He stated he continues to wear a brace on his knee.  He denied pain.  Patient participated in conversation regarding family and current events.  He often displays a bright affect and welcomes the teams visits. 3. PATIENT/CAREGIVER EDUCATION/ COPING:  Both patient and his grandson express their feelings openly.  Patient uses humor often. 4. PERSONAL EMERGENCY PLAN:  Patient's grandson will contact EMS as needed.  He will also contact patient's two daughter's for assistance. 5. COMMUNITY RESOURCES COORDINATION/ HEALTH CARE NAVIGATION:  None. 6. FINANCIAL/LEGAL CONCERNS/INTERVENTIONS:  Patient is on a fixed income.     SOCIAL HX:  Social History   Tobacco Use  . Smoking status: Former Smoker  . Smokeless tobacco: Never Used  Substance Use Topics  . Alcohol use: No    CODE STATUS:  DNR  ADVANCED DIRECTIVES: N MOST FORM COMPLETE:  N HOSPICE EDUCATION PROVIDED: N PPS:  Patient reports his appetite is normal.  He is able to stand and ambulates with a walker. Duration of visit and documentation:  60 minutes.       Z , LCSW 

## 2018-07-23 NOTE — Telephone Encounter (Signed)
Spoke with Eual Fines (grandson) to make him aware I had sent the prescription for the hospital bed to [facility] ([place] last week and [employer] mentioned they were awaiting additional information, however I had not yet received that message.  Spoke with Daria this evening and she mentioned the DME company needs an addendum with the need for the hospital bed.  I have done the addendum and corrected to have a gel overlay and faxed to [facility]. Daria mentions they will follow up with the patient within 24 hours and I confirmed the phone number they have on file.   I will follow up with them in the morning and follow up with Jamelle to inform of the update.  I have also encouraged him to sign his grandfather up on the portal to help with communication.

## 2018-07-24 NOTE — Telephone Encounter (Signed)
I have spoken with [facility] earlier today and they had not received the new chart note sent from last night, I have refaxed this information to another fax number [phone].  @ 455pm I have spoken with Bryson Ha again and she has all the information she states she is unable to give me an exact time they will call but will try to process as soon as possible.  I offered again to him to get set up on the portal and he declined, states "I will call if I have a problem".  I explained to Menard, I will be out of the office on Thur and Fri however there may be someone in the office that can help him.

## 2018-07-25 ENCOUNTER — Telehealth: Payer: Self-pay

## 2018-07-25 NOTE — Telephone Encounter (Signed)
daughter called and left message that the bed would be delivered today. company ([employer]) said they had the wrong address and phone number and that he was living in a SNF

## 2018-07-26 ENCOUNTER — Other Ambulatory Visit: Payer: Self-pay | Admitting: Nurse Practitioner

## 2018-07-26 DIAGNOSIS — I69354 Hemiplegia and hemiparesis following cerebral infarction affecting left non-dominant side: Secondary | ICD-10-CM

## 2018-07-26 DIAGNOSIS — R269 Unspecified abnormalities of gait and mobility: Secondary | ICD-10-CM

## 2018-08-03 ENCOUNTER — Ambulatory Visit: Payer: Self-pay

## 2018-08-03 DIAGNOSIS — R269 Unspecified abnormalities of gait and mobility: Secondary | ICD-10-CM

## 2018-08-03 DIAGNOSIS — I69354 Hemiplegia and hemiparesis following cerebral infarction affecting left non-dominant side: Secondary | ICD-10-CM

## 2018-08-03 DIAGNOSIS — I10 Essential (primary) hypertension: Secondary | ICD-10-CM

## 2018-08-03 NOTE — Chronic Care Management (AMB) (Signed)
  Care Management Note   Brett Mora is a 85 y.o. year old male who is a primary care patient of Brett Mora, Brett Mora . The CM team was consulted for assistance with chronic disease management and care coordination.   Review of patient status, including review of consultants reports, and collaboration with appropriate care team members and the patient's provider was performed as part of comprehensive patient evaluation and provision of chronic care management services. Telephone outreach to patient today to introduce CCM services.   I reached out to Brett Mora daughter and caregiver Brett Mora by phone today.   Brett Mora was given information about Chronic Care Management services today including:  1. CCM service includes personalized support from designated clinical staff supervised by his physician, including individualized plan of care and coordination with other care providers 2. 24/7 contact phone numbers for assistance for urgent and routine care needs. 3. Service will only be billed when office clinical staff spend 20 minutes or more in a month to coordinate care. 4. Only one practitioner may furnish and bill the service in a calendar month. 5. The patient may stop CCM services at any time (effective at the end of the month) by phone call to the office staff. 6. The patient will be responsible for cost sharing (co-pay) of up to 20% of the service fee (after annual deductible is met).   Patient did not agree to services and does not wish to consider at this time. The patient is currently enrolled in Palliative Care services and receives a visit from the Palliative Care nurse and social worker on a regular basis. Brett Mora declined CCM services for her dad stating "he does not do well with change and he has what he needs right now". SW provided information on how to contact the CCM team if the patient would like to enroll.   Follow Up Plan: Client will contact the provider if CCM  enrollment is desired at a later date.   Brett Mora, BSW, CDP [facility] Care Management Social Worker [phone]  Total time spent performing care coordination and/or care management activities with the patient by phone or face to face = 15 minutes.

## 2018-08-03 NOTE — Patient Instructions (Signed)
Social Worker Visit Information     Materials provided: No: Patient declined  Brett Mora was given information about Chronic Care Management services today including:  1. CCM service includes personalized support from designated clinical staff supervised by his physician, including individualized plan of care and coordination with other care providers 2. 24/7 contact phone numbers for assistance for urgent and routine care needs. 3. Service will only be billed when office clinical staff spend 20 minutes or more in a month to coordinate care. 4. Only one practitioner may furnish and bill the service in a calendar month. 5. The patient may stop CCM services at any time (effective at the end of the month) by phone call to the office staff. 6. The patient will be responsible for cost sharing (co-pay) of up to 20% of the service fee (after annual deductible is met).  Patient did not agree to services and does not wish to consider at this time.  The patient verbalized understanding of instructions provided today and declined a print copy of patient instruction materials.   Follow up plan: Client will contact his provider if CCM services are desired at a later date   Daneen Schick, BSW, CDP [facility] Management Social Worker [phone]

## 2018-08-11 ENCOUNTER — Other Ambulatory Visit: Payer: Self-pay | Admitting: Nurse Practitioner

## 2018-08-20 ENCOUNTER — Other Ambulatory Visit: Payer: Self-pay

## 2018-08-20 ENCOUNTER — Other Ambulatory Visit: Payer: Medicare Other | Admitting: *Deleted

## 2018-08-20 ENCOUNTER — Other Ambulatory Visit: Payer: Medicare Other | Admitting: Licensed Clinical Social Worker

## 2018-08-20 DIAGNOSIS — Z515 Encounter for palliative care: Secondary | ICD-10-CM

## 2018-08-20 NOTE — Progress Notes (Signed)
COMMUNITY PALLIATIVE CARE SW NOTE  PATIENT NAME: Brett Mora DOB: 1919/12/25 MRN: [id number]  PRIMARY CARE PROVIDER: Minette Brine, FNP  RESPONSIBLE PARTY:  Acct ID - Guarantor Home Phone Work Phone Relationship [place] Type  [id number] Brett, Mora [phone]  Self P/F     [address], [place], [place] [address]   Due to the COVID-19 crisis, this virtual check-in visit was done via telephone from my office and it was initiated and consent given by this patientand orfamily.  PLAN OF CARE and INTERVENTIONS:             1. GOALS OF CARE/ ADVANCE CARE PLANNING:  Goal is for patient to remain in his home with his grandson.  He is a DNR. 2. SOCIAL/EMOTIONAL/SPIRITUAL ASSESSMENT/ INTERVENTIONS:  SW and Palliative Care RN, Brett Mora, made a joint call to patient and his grandson.  Grandson stated he did not have video capatiblity on any devices and requested a virtual check-in visit.  Patient stated he had a cold consisting of congestion.  His appetite remains normal.  Patient's knee gives him discomfort at times and is relieved by Tylenol per patient.  Grandson also discussed other medical concerns addressed by RN. 3. PATIENT/CAREGIVER EDUCATION/ COPING:  Patient and his grandson express their feelings openly.  Patient also uses humor. 4. PERSONAL EMERGENCY PLAN:  Family will contact EMS.  Brett Mora will also call patient's daughters for direction.  5. COMMUNITY RESOURCES COORDINATION/ HEALTH CARE NAVIGATION:  None. 6. FINANCIAL/LEGAL CONCERNS/INTERVENTIONS:  Patient is on a fixed income.     SOCIAL HX:  Social History   Tobacco Use  . Smoking status: Former Research scientist (life sciences)  . Smokeless tobacco: Never Used  Substance Use Topics  . Alcohol use: No    CODE STATUS:  DNR  ADVANCED DIRECTIVES: N MOST FORM COMPLETE:  N HOSPICE EDUCATION PROVIDED: N PPS:  Patient's appetite is good.  He ambulates with his walker. Duration of visit and documentation:  40 minutes.      Brett Corn Erminia Mcnew, LCSW

## 2018-08-20 NOTE — Progress Notes (Signed)
COMMUNITY PALLIATIVE CARE RN NOTE  PATIENT NAME: Rylie Knierim DOB: 09-15-1919 MRN: [id number]  PRIMARY CARE PROVIDER: Minette Brine, FNP  RESPONSIBLE PARTY:  Acct ID - Guarantor Home Phone Work Phone Relationship [place] Type  [id number] PIERCE, BAROCIO [phone]  Self P/F     [address] [place], [place] [address]    Due to the COVID-19 crisis, this virtual check-in visit was done via telephone from my office and it was initiated and consent by this patient and or family.  PLAN OF CARE and INTERVENTION:  1. ADVANCE CARE PLANNING/GOALS OF CARE: Goal is to remain at home with his grandson as caretaker and avoid hospitalizations. He is a DNR. 2. PATIENT/CAREGIVER EDUCATION: Reinforced Safe Mobility and Pain Management 3. DISEASE STATUS: Joint telephone call made with Palliative Care SW, Lynn Duffy. Spoke with patient and grandson. Patient states that he is doing ok. He does experience some joint pain, mainly in his R knee and also lower back pain. He is currently taking Tylenol 1000 mg every 6 hours. Explained to patient and grandson that it would be best that patient not take more than 3000 mg in a 24 hour period, especially d/t patient's age. Both verbalized understanding and will contact me if current pain regimen is not effective in managing pain. He continues to wear a R knee brace for more stability and L wrist brace for contracture. Patient also has L sided weakness d/t history of CVA. Patient says that he feels that he has a cold. C/o mild nasal/sinus congestion. Denies any cough or fever. He remains ambulatory with his walker. [name] states that he does have to assist him with dressing at times when he is feeling weaker, but most days he is able to perform this task with minimal assistance. He is able to shower himself with stand-by assistance. He is continent of both bowel and bladder. His appetite is good. Grandson reports that patient has been out of his Iron supplements for about a month.  Pharmacy ran out and has them on backorder. Patient does feel slightly more tired and sluggish. Provided list of foods that is rich in iron to help in the meantime. Will continue to monitor.    HISTORY OF PRESENT ILLNESS:  This is a 85 yo male who resides in his home with his grandson as his caregiver. Palliative Care Team continues to follow patient. Next visit scheduled for 09/17/2018.  CODE STATUS: DNR ADVANCED DIRECTIVES: N MOST FORM: no PPS: 50%  (Duration of visit and documentation 45 minutes)     Daryl Eastern, RN BSN

## 2018-09-04 ENCOUNTER — Other Ambulatory Visit: Payer: Self-pay | Admitting: Nurse Practitioner

## 2018-09-10 ENCOUNTER — Telehealth: Payer: Self-pay | Admitting: Nurse Practitioner

## 2018-09-10 NOTE — Telephone Encounter (Signed)
Pt agreed to phone visit w grandson on the phone.

## 2018-09-14 ENCOUNTER — Ambulatory Visit: Payer: Medicare Other | Admitting: Nurse Practitioner

## 2018-09-20 ENCOUNTER — Other Ambulatory Visit: Payer: Self-pay

## 2018-09-20 ENCOUNTER — Ambulatory Visit (INDEPENDENT_AMBULATORY_CARE_PROVIDER_SITE_OTHER): Payer: Medicare Other | Admitting: Nurse Practitioner

## 2018-09-20 ENCOUNTER — Encounter: Payer: Self-pay | Admitting: Nurse Practitioner

## 2018-09-20 VITALS — Temp 97.5°F

## 2018-09-20 DIAGNOSIS — E782 Mixed hyperlipidemia: Secondary | ICD-10-CM

## 2018-09-20 DIAGNOSIS — I10 Essential (primary) hypertension: Secondary | ICD-10-CM

## 2018-09-20 DIAGNOSIS — D509 Iron deficiency anemia, unspecified: Secondary | ICD-10-CM

## 2018-09-20 NOTE — Progress Notes (Addendum)
Virtual Visit via Telephone    This visit type was conducted due to national recommendations for restrictions regarding the COVID-19 Pandemic (e.g. social distancing) in an effort to limit this patient's exposure and mitigate transmission in our community.  Patients identity confirmed using two different identifiers.  This format is felt to be most appropriate for this patient at this time.  All issues noted in this document were discussed and addressed.  No physical exam was performed (except for noted visual exam findings with Video Visits).    Date:  09/20/2018   ID:  Brett Mora, DOB [birth year], MRN [id number]  Patient Location:  Home - spoke with grandson [name]   Provider location:   Office    Chief Complaint:  HTN follow up  History of Present Illness:    Brett Mora is a 85 y.o. male who presents via video conferencing for a telehealth visit today.    The patient does not have symptoms concerning for COVID-19 infection (fever, chills, cough, or new shortness of breath).   Hypertension  This is a chronic problem. The current episode started more than 1 year ago. The problem is unchanged. Pertinent negatives include no anxiety, blurred vision, malaise/fatigue or peripheral edema. Risk factors for coronary artery disease include obesity and sedentary lifestyle. Past treatments include calcium channel blockers.     Past Medical History:  Diagnosis Date  . Blood transfusion without reported diagnosis   . Hyperlipemia   . Hypertension   . Iron deficiency   . Stroke [facility])    Past Surgical History:  Procedure Laterality Date  . BACK SURGERY       Current Meds  Medication Sig  . amLODipine (NORVASC) 5 MG tablet TAKE 1 TABLET EVERY DAY  . Calcium Carbonate-Vitamin D (CALCIUM 600+D) 600-400 MG-UNIT per tablet Take 1 tablet by mouth 3 (three) times daily with meals.   . Cholecalciferol (VITAMIN D PO) Take 1 tablet by mouth daily.  . diclofenac sodium (VOLTAREN) 1 % GEL Apply  2 g topically 4 (four) times daily.  Marland Kitchen FERRO-SEQUELS 65-25 MG TBCR Take 1 tablet by mouth daily.  . hydrochlorothiazide (HYDRODIURIL) 25 MG tablet TAKE 1 TABLET EVERY DAY  . KLOR-CON 10 10 MEQ tablet Take 10 mEq by mouth daily.  Marland Kitchen loratadine (CLARITIN) 10 MG tablet TAKE 1 TABLET EVERY DAY  . simvastatin (ZOCOR) 20 MG tablet TAKE 1 TABLET EVERY DAY     Allergies:   Patient has no known allergies.   Social History   Tobacco Use  . Smoking status: Former Research scientist (life sciences)  . Smokeless tobacco: Never Used  Substance Use Topics  . Alcohol use: No  . Drug use: No     Family Hx: The patient's family history is not on file.  ROS:   Please see the history of present illness.    Review of Systems  Constitutional: Negative.  Negative for chills, fever and malaise/fatigue.  Eyes: Negative for blurred vision.  Respiratory: Negative.   Genitourinary: Negative.   Neurological: Negative for dizziness, tingling and weakness.  Psychiatric/Behavioral: Negative.     All other systems reviewed and are negative.   Labs/Other Tests and Data Reviewed:    Recent Labs: 05/16/2018: ALT 9; BUN 14; Creatinine, Ser 1.12; Hemoglobin 12.9; Platelets 205; Potassium 3.8; Sodium 143   Recent Lipid Panel Lab Results  Component Value Date/Time   CHOL 149 05/16/2018 04:39 PM   TRIG 143 05/16/2018 04:39 PM   HDL 40 05/16/2018 04:39 PM   CHOLHDL 3.7 05/16/2018  04:39 PM   CHOLHDL 2.5 07/12/2013 03:51 AM   LDLCALC 80 05/16/2018 04:39 PM    Wt Readings from Last 3 Encounters:  05/16/18 240 lb (108.9 kg)  07/11/13 221 lb 9 oz (100.5 kg)  03/26/13 230 lb (104.3 kg)     Exam:    Vital Signs:  Temp (!) 97.5 F (36.4 C) (Oral)     Physical Exam Telephone visit.   ASSESSMENT & PLAN:    1. Essential hypertension . No blood pressure this visit due to telephone  . The importance of regular exercise and dietary modification was stressed to the patient.  . Stressed importance of losing ten percent of her body  weight to help with B/P control.  . The weight loss would help with decreasing cardiac and cancer risk as well.   2. Mixed hyperlipidemia  Chronic, controlled  Continue with current medications  Tolerating well  3. Iron deficiency anemia, unspecified iron deficiency anemia type  Continue with iron supplement   COVID-19 Education: The signs and symptoms of COVID-19 were discussed with the patient and how to seek care for testing (follow up with PCP or arrange E-visit).  The importance of social distancing was discussed today.  Patient Risk:   After full review of this patients clinical status, I feel that they are at least moderate risk at this time.  Time:   Today, I have spent 11 minutes/ seconds with the patient with telehealth technology discussing above diagnoses.     Medication Adjustments/Labs and Tests Ordered: Current medicines are reviewed at length with the patient today.  Concerns regarding medicines are outlined above.   Tests Ordered: No orders of the defined types were placed in this encounter.   Medication Changes: No orders of the defined types were placed in this encounter.   Disposition:  Follow up in 3 month(s)  Signed, Minette Brine, FNP

## 2018-10-03 ENCOUNTER — Encounter: Payer: Self-pay | Admitting: Nurse Practitioner

## 2018-11-05 ENCOUNTER — Telehealth: Payer: Self-pay | Admitting: Adult Health Nurse Practitioner

## 2018-11-05 NOTE — Telephone Encounter (Signed)
Called and set up an appointment for Thursday, 7/2, at 2:30 pm Brett Mora K. Olena Heckle NP

## 2018-11-08 ENCOUNTER — Other Ambulatory Visit: Payer: Medicare Other | Admitting: Adult Health Nurse Practitioner

## 2018-11-08 DIAGNOSIS — Z515 Encounter for palliative care: Secondary | ICD-10-CM

## 2018-11-09 ENCOUNTER — Other Ambulatory Visit: Payer: Self-pay

## 2018-11-09 NOTE — Progress Notes (Signed)
[facility] Consult Note Telephone: [phone]  Fax: [phone]  PATIENT NAME: Brett Mora DOB: [birth year] MRN: [id number]  PRIMARY CARE PROVIDER:   Minette Brine, FNP  REFERRING PROVIDER:  Minette Brine, [address] [place],  [place] [address]  RESPONSIBLE PARTY:   Self and Talmage Nap  [phone]      RECOMMENDATIONS and PLAN:  1.  Pain.  Patient does have pain in knees related to OA.  [name] says he put muscle cream on it and gives Tylenol but tries to limit the Tylenol.  States that he gives him theraflu at times when needed and instructed him to look at label for any added acetaminophen to make sure he does not go past 3000mg  daily.  Does wear knee braces when his knees are hurting.    2.  Appetite.  Patient's appetite is good and he has not had any weight changes.  Denies constipation  3.  Mobility/functionality.  Patient is able to ambulate with a walker.  Needs stand by assist with bathing and help with dressing when he feels weak.  Patient is continent of B&B.  A&O x3.  No falls reported  4. Goals of Care.  Patient a DNR.  Goal is to stay at home with grandson.    I spent 30 minutes providing this consultation,  from 2;30 to 3:00. More than 50% of the time in this consultation was spent coordinating communication.   HISTORY OF PRESENT ILLNESS:  Brett Mora is a 85 y.o. year old male with multiple medical problems including HLD, HTN, iron deficiency anemia, left sided weakness post CVA. Palliative Care was asked to help address goals of care.   CODE STATUS: DNR  PPS: 50% HOSPICE ELIGIBILITY/DIAGNOSIS: TBD  PHYSICAL EXAM:  BP 120/58  HR 56  O2 sat 97% General: patient sitting in chair in kitchen in NAD Cardiovascular: regular rate and rhythm Pulmonary: lung sounds clear with normal respiratory effort Abdomen: soft, nontender, + bowel sounds GU: no suprapubic tenderness Extremities: no edema,  left wrist brace in place due to contracture Skin: no rashes Neurological: Weakness but otherwise nonfocal  PAST MEDICAL HISTORY:  Past Medical History:  Diagnosis Date  . Blood transfusion without reported diagnosis   . Hyperlipemia   . Hypertension   . Iron deficiency   . Stroke [facility])     SOCIAL HX:  Social History   Tobacco Use  . Smoking status: Former Research scientist (life sciences)  . Smokeless tobacco: Never Used  Substance Use Topics  . Alcohol use: No    ALLERGIES: No Known Allergies   PERTINENT MEDICATIONS:  Outpatient Encounter Medications as of 11/08/2018  Medication Sig  . amLODipine (NORVASC) 5 MG tablet TAKE 1 TABLET EVERY DAY  . Calcium Carbonate-Vitamin D (CALCIUM 600+D) 600-400 MG-UNIT per tablet Take 1 tablet by mouth 3 (three) times daily with meals.   . Cholecalciferol (VITAMIN D PO) Take 1 tablet by mouth daily.  . diclofenac sodium (VOLTAREN) 1 % GEL Apply 2 g topically 4 (four) times daily.  Marland Kitchen FERRO-SEQUELS 65-25 MG TBCR Take 1 tablet by mouth daily.  . hydrochlorothiazide (HYDRODIURIL) 25 MG tablet TAKE 1 TABLET EVERY DAY  . KLOR-CON 10 10 MEQ tablet Take 10 mEq by mouth daily.  Marland Kitchen loratadine (CLARITIN) 10 MG tablet TAKE 1 TABLET EVERY DAY  . simvastatin (ZOCOR) 20 MG tablet TAKE 1 TABLET EVERY DAY   No facility-administered encounter medications on file as of 11/08/2018.  Nilam Quakenbush Jenetta Downer, NP

## 2018-12-04 ENCOUNTER — Other Ambulatory Visit: Payer: Self-pay | Admitting: Nurse Practitioner

## 2018-12-05 ENCOUNTER — Other Ambulatory Visit: Payer: Self-pay | Admitting: Nurse Practitioner

## 2018-12-11 ENCOUNTER — Other Ambulatory Visit: Payer: Self-pay

## 2018-12-11 ENCOUNTER — Other Ambulatory Visit: Payer: Medicare Other | Admitting: Adult Health Nurse Practitioner

## 2018-12-11 DIAGNOSIS — Z515 Encounter for palliative care: Secondary | ICD-10-CM

## 2018-12-11 NOTE — Progress Notes (Signed)
[facility] Consult Note Telephone: [phone]  Fax: [phone]  PATIENT NAME: Brett Mora DOB: Feb 28, 1920 MRN: [id number]  PRIMARY CARE PROVIDER:   Minette Brine, FNP  REFERRING PROVIDER:  Minette Brine, [address] [place],  [place] [address]  RESPONSIBLE PARTY:   Self and Talmage Nap  [phone]    RECOMMENDATIONS and PLAN:  1.  Pain.  No changes.  Still has arthritis pain in knees which is relieved with muscle cream and Tylenol.  [name] makes sure he does not have more than 3000mg  acetaminophen per day.  Continue current pain regimen.  2.  Appetite.  [name] states that he has been eating more.  Suggested that he make sure he is getting protein and limiting carbs.  Suggested eating smaller frequent meals to see if that helps keeps him satisfied better. Will evaluate again at our next visit in a month.  3.  Goals of Care.  Patient a DNR.  Goal is to stay at home with grandson.  No reported falls or changes in weight.  Does state that he will feel congested or like he is coming down with something.  [name] states that he does have to make sure the [facility] vents aren't blowing directly on him.  May possibly be due to allergies if worse with AC blowing on him.  Suggested allergy nasal spray such as flonase or nasacort.  Next visit is in one month  I spent 30 minutes providing this consultation,  from 12:00 to 12:30. More than 50% of the time in this consultation was spent coordinating communication.   HISTORY OF PRESENT ILLNESS:  Brett Mora is a 85 y.o. year old male with multiple medical problems including HLD, HTN, iron deficiency anemia, left sided weakness post CVA. Palliative Care was asked to help address goals of care.   CODE STATUS: DNR  PPS: 50% HOSPICE ELIGIBILITY/DIAGNOSIS: TBD  PHYSICAL EXAM:  BP 142/62  HR 46  O2 sat 98% General: patient sitting in chair in NAD Cardiovascular: regular rate and  rhythm Pulmonary: lung sounds clear with normal respiratory effort Abdomen: soft, nontender, + bowel sounds GU: no suprapubic tenderness Extremities: no edema, left wrist brace in place due to contracture Skin: no rashes Neurological: Weakness but otherwise nonfocal  PAST MEDICAL HISTORY:  Past Medical History:  Diagnosis Date  . Blood transfusion without reported diagnosis   . Hyperlipemia   . Hypertension   . Iron deficiency   . Stroke [facility])     SOCIAL HX:  Social History   Tobacco Use  . Smoking status: Former Research scientist (life sciences)  . Smokeless tobacco: Never Used  Substance Use Topics  . Alcohol use: No    ALLERGIES: No Known Allergies   PERTINENT MEDICATIONS:  Outpatient Encounter Medications as of 12/11/2018  Medication Sig  . amLODipine (NORVASC) 5 MG tablet TAKE 1 TABLET BY MOUTH EVERY DAY  . Calcium Carbonate-Vitamin D (CALCIUM 600+D) 600-400 MG-UNIT per tablet Take 1 tablet by mouth 3 (three) times daily with meals.   . Cholecalciferol (VITAMIN D PO) Take 1 tablet by mouth daily.  . diclofenac sodium (VOLTAREN) 1 % GEL Apply 2 g topically 4 (four) times daily.  Marland Kitchen FERRO-SEQUELS 65-25 MG TBCR Take 1 tablet by mouth daily.  . hydrochlorothiazide (HYDRODIURIL) 25 MG tablet TAKE 1 TABLET EVERY DAY  . KLOR-CON 10 10 MEQ tablet Take 10 mEq by mouth daily.  Marland Kitchen loratadine (CLARITIN) 10 MG tablet TAKE 1 TABLET BY MOUTH EVERY DAY  .  simvastatin (ZOCOR) 20 MG tablet TAKE 1 TABLET EVERY DAY   No facility-administered encounter medications on file as of 12/11/2018.       Nussen Pullin Jenetta Downer, NP

## 2018-12-26 ENCOUNTER — Other Ambulatory Visit: Payer: Self-pay

## 2018-12-26 ENCOUNTER — Telehealth: Payer: Self-pay

## 2018-12-26 ENCOUNTER — Other Ambulatory Visit: Payer: Medicare Other | Admitting: Adult Health Nurse Practitioner

## 2018-12-26 DIAGNOSIS — Z515 Encounter for palliative care: Secondary | ICD-10-CM

## 2018-12-26 NOTE — Telephone Encounter (Signed)
Patient's daughter called stating pt fell and she think he may have had a stroke and she would like for him to have an appointment today.  I HAVE RETURNED HER CALL AND ADVISED WE DIDN'T HAVE ANY APPOINTMENTS AVAILABLE FOR TODAY BUT I OFFERED ONE FOR

## 2018-12-26 NOTE — Progress Notes (Signed)
    [facility] Consult Note Telephone: [phone]  Fax: [phone]  PATIENT NAME: Brett Mora DOB: 10/06/1919 MRN: [id number]  PRIMARY CARE PROVIDER:   Minette Brine, FNP  REFERRING PROVIDER:  Minette Brine, [address] [place],  [place] [address]  RESPONSIBLE PARTY:   Self and grandson, [name] [phone]  Due to the COVID-19 crisis, this visit was done via telemedicine and it was initiated and consent by this patient and or family. Video-audio (telehealth) contact was unable to be done due to technical barriers from the patient's side.    RECOMMENDATIONS and PLAN:  This telephone visit was initiated by [name], daughter, of the patient. Reported that patient had a fall today.  He had a fall today in which he was leaving the bathroom when he lost feeling in his left leg and it gave out on him. She said that her nephew caught him before he fell but he did lay on the floor with his leg twisted for 20 minutes. When she got to the house he was lying comfortably in bed. Still did not have feeling in left leg. No complaints of pain just feeling weak and numb. No AMS, difficulty speaking, or facial drooping. Does state that his appetite has been decreased today. Suspect TIA or blood clot to the leg. Patient refuses to go to hospital for evaluation.  Have reached out to PCP via Epic email to see if she had time in her schedule to evaluate patient soon and possibly to start PT services after evaluation.     I spent 30 minutes providing this consultation,  from 1:30 to 2:00. More than 50% of the time in this consultation was spent coordinating communication.   HISTORY OF PRESENT ILLNESS:  Brett Mora is a 85 y.o. year old male with multiple medical problems including HLD, HTN, iron deficiency anemia, left sided weakness post CVA. Palliative Care was asked to help address goals of care.   CODE STATUS: DNR  PPS: 50%  HOSPICE ELIGIBILITY/DIAGNOSIS: TBD  PAST MEDICAL HISTORY:  Past Medical History:  Diagnosis Date  . Blood transfusion without reported diagnosis   . Hyperlipemia   . Hypertension   . Iron deficiency   . Stroke [facility])     SOCIAL HX:  Social History   Tobacco Use  . Smoking status: Former Research scientist (life sciences)  . Smokeless tobacco: Never Used  Substance Use Topics  . Alcohol use: No    ALLERGIES: No Known Allergies   PERTINENT MEDICATIONS:  Outpatient Encounter Medications as of 12/26/2018  Medication Sig  . amLODipine (NORVASC) 5 MG tablet TAKE 1 TABLET BY MOUTH EVERY DAY  . Calcium Carbonate-Vitamin D (CALCIUM 600+D) 600-400 MG-UNIT per tablet Take 1 tablet by mouth 3 (three) times daily with meals.   . Cholecalciferol (VITAMIN D PO) Take 1 tablet by mouth daily.  . diclofenac sodium (VOLTAREN) 1 % GEL Apply 2 g topically 4 (four) times daily.  Marland Kitchen FERRO-SEQUELS 65-25 MG TBCR Take 1 tablet by mouth daily.  . hydrochlorothiazide (HYDRODIURIL) 25 MG tablet TAKE 1 TABLET EVERY DAY  . KLOR-CON 10 10 MEQ tablet Take 10 mEq by mouth daily.  Marland Kitchen loratadine (CLARITIN) 10 MG tablet TAKE 1 TABLET BY MOUTH EVERY DAY  . simvastatin (ZOCOR) 20 MG tablet TAKE 1 TABLET EVERY DAY   No facility-administered encounter medications on file as of 12/26/2018.       Grantley Savage Jenetta Downer, NP

## 2018-12-26 NOTE — Telephone Encounter (Signed)
I have returned the patient's daughter's call to schedule an appointment and she stated she would check with her other sister and give me a call back. YRL,RMA

## 2018-12-27 ENCOUNTER — Encounter: Payer: Self-pay | Admitting: Nurse Practitioner

## 2018-12-27 ENCOUNTER — Other Ambulatory Visit: Payer: Self-pay

## 2018-12-27 ENCOUNTER — Telehealth: Payer: Self-pay

## 2018-12-27 ENCOUNTER — Telehealth: Payer: Self-pay | Admitting: Adult Health Nurse Practitioner

## 2018-12-27 ENCOUNTER — Ambulatory Visit: Payer: Medicare Other | Admitting: Nurse Practitioner

## 2018-12-27 ENCOUNTER — Ambulatory Visit (INDEPENDENT_AMBULATORY_CARE_PROVIDER_SITE_OTHER): Payer: Medicare Other | Admitting: Nurse Practitioner

## 2018-12-27 VITALS — Temp 97.8°F | Wt 240.0 lb

## 2018-12-27 DIAGNOSIS — Y92009 Unspecified place in unspecified non-institutional (private) residence as the place of occurrence of the external cause: Secondary | ICD-10-CM | POA: Diagnosis not present

## 2018-12-27 DIAGNOSIS — W19XXXA Unspecified fall, initial encounter: Secondary | ICD-10-CM

## 2018-12-27 DIAGNOSIS — I69354 Hemiplegia and hemiparesis following cerebral infarction affecting left non-dominant side: Secondary | ICD-10-CM | POA: Diagnosis not present

## 2018-12-27 DIAGNOSIS — D509 Iron deficiency anemia, unspecified: Secondary | ICD-10-CM

## 2018-12-27 DIAGNOSIS — K5900 Constipation, unspecified: Secondary | ICD-10-CM | POA: Diagnosis not present

## 2018-12-27 MED ORDER — FERROUS SULFATE 325 (65 FE) MG PO TABS: 325.0000 mg | ORAL_TABLET | Freq: Every day | ORAL | 3 refills | Status: DC

## 2018-12-27 MED ORDER — LUBIPROSTONE 8 MCG PO CAPS: 8.0000 ug | ORAL_CAPSULE | Freq: Two times a day (BID) | ORAL | 2 refills | Status: DC

## 2018-12-27 NOTE — Telephone Encounter (Signed)
Patient's daughter consented to a virtual appointment. YRL,RMA

## 2018-12-27 NOTE — Telephone Encounter (Signed)
Patient had a fall yesterday and has been very weak.  Had appointment scheduled with PCP today for evaluation post fall and he did not go to this.  Daughter concerned about what next steps are.  Have scheduled an in person visit for 8/24 at 3pm to evaluate patient and to discuss options.  Daughter states that patient has been talking about feeling tired and talking about funeral arrangements.  Did discuss hospice and will evaluate further at our appointment hospice readiness. Mikel Pyon K. Olena Heckle NP

## 2018-12-27 NOTE — Progress Notes (Signed)
Virtual Visit via Video   This visit type was conducted due to national recommendations for restrictions regarding the COVID-19 Pandemic (e.g. social distancing) in an effort to limit this patient's exposure and mitigate transmission in our community.  Due to his co-morbid illnesses, this patient is at least at moderate risk for complications without adequate follow up.  This format is felt to be most appropriate for this patient at this time.  All issues noted in this document were discussed and addressed.  A limited physical exam was performed with this format.    This visit type was conducted due to national recommendations for restrictions regarding the COVID-19 Pandemic (e.g. social distancing) in an effort to limit this patient's exposure and mitigate transmission in our community.  Patients identity confirmed using two different identifiers.  This format is felt to be most appropriate for this patient at this time.  All issues noted in this document were discussed and addressed.  No physical exam was performed (except for noted visual exam findings with Video Visits).    Date:  01/05/2019   ID:  Brett Mora, DOB 01/09/1920, MRN [id number]  Patient Location:  Home - spoke with Brett Mora,   Provider location:   Office    Chief Complaint:  Elevated blood sugar   History of Present Illness:    Brett Mora is a 85 y.o. male who presents via video conferencing for a telehealth visit today.    The patient does not have symptoms concerning for COVID-19 infection (fever, chills, cough, or new shortness of breath).   Brett Mora is a 85 y/o AA male who had an episode yesterday while on his way out of the bathroom yelling help me I am about to fall, so his Mora assisted him to the floor he did not have a complete fall. He was dragging his foot, did not have feeling.  Left leg , able to lift up and down.  Mora slid him down.  Knee may be a little swollen. Not weak today.  Was also screaming  for the family to help them.      Iron pills have been on back order  His daughter reports he is not interested in going to the urologist for any prostate treatment.    Fall The accident occurred 12 to 24 hours ago. He fell from an unknown height. The patient is experiencing no pain. Pertinent negatives include no headaches.     Past Medical History:  Diagnosis Date  . Blood transfusion without reported diagnosis   . Hyperlipemia   . Hypertension   . Iron deficiency   . Stroke [facility])    Past Surgical History:  Procedure Laterality Date  . BACK SURGERY       Current Meds  Medication Sig  . amLODipine (NORVASC) 5 MG tablet TAKE 1 TABLET BY MOUTH EVERY DAY  . Calcium Carbonate-Vitamin D (CALCIUM 600+D) 600-400 MG-UNIT per tablet Take 1 tablet by mouth 3 (three) times daily with meals.   . Cholecalciferol (VITAMIN D PO) Take 1 tablet by mouth daily.  . diclofenac sodium (VOLTAREN) 1 % GEL Apply 2 g topically 4 (four) times daily. (Patient taking differently: Apply 2 g topically as needed. )  . hydrochlorothiazide (HYDRODIURIL) 25 MG tablet TAKE 1 TABLET EVERY DAY  . simvastatin (ZOCOR) 20 MG tablet TAKE 1 TABLET EVERY DAY     Allergies:   Patient has no known allergies.   Social History   Tobacco Use  . Smoking status:  Former Smoker  . Smokeless tobacco: Never Used  Substance Use Topics  . Alcohol use: No  . Drug use: No     Family Hx: The patient's family history is not on file.  ROS:   Please see the history of present illness.    Review of Systems  Constitutional: Negative.   Respiratory: Negative.   Cardiovascular: Negative.   Neurological: Negative for dizziness, weakness and headaches.  Psychiatric/Behavioral: Negative.     All other systems reviewed and are negative.   Labs/Other Tests and Data Reviewed:    Recent Labs: 05/16/2018: ALT 9; BUN 14; Creatinine, Ser 1.12; Hemoglobin 12.9; Platelets 205; Potassium 3.8; Sodium 143   Recent Lipid Panel Lab  Results  Component Value Date/Time   CHOL 149 05/16/2018 04:39 PM   TRIG 143 05/16/2018 04:39 PM   HDL 40 05/16/2018 04:39 PM   CHOLHDL 3.7 05/16/2018 04:39 PM   CHOLHDL 2.5 07/12/2013 03:51 AM   LDLCALC 80 05/16/2018 04:39 PM    Wt Readings from Last 3 Encounters:  12/27/18 240 lb (108.9 kg)  05/16/18 240 lb (108.9 kg)  07/11/13 221 lb 9 oz (100.5 kg)     Exam:    Vital Signs:  Temp 97.8 F (36.6 C) (Oral)   Wt 240 lb (108.9 kg)   BMI 35.44 kg/m     Physical Exam  Constitutional: He is well-developed, well-nourished, and in no distress. No distress.  Neurological: He is alert.  Psychiatric: Mood, affect and judgment normal.    ASSESSMENT & PLAN:     1. Constipation, unspecified constipation type  He has been taking Milk of magnesia  Will try him on amitza, this constipation may be related to the iron  Encouraged to increase water intake - lubiprostone (AMITIZA) 8 MCG capsule; Take 1 capsule (8 mcg total) by mouth 2 (two) times daily with a meal.  Dispense: 30 capsule; Refill: 2  2. Iron deficiency anemia, unspecified iron deficiency anemia type  Unable to get the fusion plus due to back order  Will send Rx for iron supplement - ferrous sulfate 325 (65 FE) MG tablet; Take 1 tablet (325 mg total) by mouth daily.  Dispense: 30 tablet; Refill: 3  3. Fall, initial encounter  Near fall at home which may be related to a TIA since he had weakness on his left leg  He no longer has the weakness says he is doing better  I have advised him to take ASA 81 mg daily   His daughter would like for him to have [facility] physical therapy and nurse  - Ambulatory referral to [facility]  4. Hemiparesis affecting left side as late effect of cerebrovascular accident [facility])  From a previous CVA - Ambulatory referral to [facility]   COVID-19 Education: The signs and symptoms of COVID-19 were discussed with the patient and how to seek care for testing (follow up with PCP or arrange  E-visit).  The importance of social distancing was discussed today.  Patient Risk:   After full review of this patients clinical status, I feel that they are at least moderate risk at this time.  Time:   Today, I have spent 18 minutes/ seconds with the patient with telehealth technology discussing above diagnoses.     Medication Adjustments/Labs and Tests Ordered: Current medicines are reviewed at length with the patient today.  Concerns regarding medicines are outlined above.   Tests Ordered: Orders Placed This Encounter  Procedures  . Ambulatory referral to [facility]  Medication Changes: Meds ordered this encounter  Medications  . lubiprostone (AMITIZA) 8 MCG capsule    Sig: Take 1 capsule (8 mcg total) by mouth 2 (two) times daily with a meal.    Dispense:  30 capsule    Refill:  2  . ferrous sulfate 325 (65 FE) MG tablet    Sig: Take 1 tablet (325 mg total) by mouth daily.    Dispense:  30 tablet    Refill:  3    Disposition:  Follow up prn  Signed, Minette Brine, FNP

## 2018-12-31 ENCOUNTER — Telehealth: Payer: Self-pay | Admitting: Adult Health Nurse Practitioner

## 2018-12-31 NOTE — Telephone Encounter (Signed)
Returned daughter's call.  Were supposed to have in person visit today but she wanted to cancel at this time as her father did have a telehealth visit with his PCP and home health therapy was ordered.  Believe that based on last week's symptoms he had a TIA.  Will keep our follow up appointment on 01/08/2019. Lylie Blacklock K. Olena Heckle NP

## 2019-01-02 ENCOUNTER — Other Ambulatory Visit: Payer: Self-pay

## 2019-01-02 DIAGNOSIS — K5900 Constipation, unspecified: Secondary | ICD-10-CM

## 2019-01-03 ENCOUNTER — Ambulatory Visit: Payer: Self-pay

## 2019-01-03 ENCOUNTER — Telehealth: Payer: Self-pay

## 2019-01-03 DIAGNOSIS — I10 Essential (primary) hypertension: Secondary | ICD-10-CM

## 2019-01-03 DIAGNOSIS — I69354 Hemiplegia and hemiparesis following cerebral infarction affecting left non-dominant side: Secondary | ICD-10-CM

## 2019-01-03 DIAGNOSIS — E782 Mixed hyperlipidemia: Secondary | ICD-10-CM

## 2019-01-03 NOTE — Chronic Care Management (AMB) (Signed)
  Care Management Note   Brett Mora is a 85 y.o. year old male who is a primary care patient of Brett Mora, Brett Mora . The CM team was consulted for assistance with chronic care management and care coordination related to [place].   Review of patient status, including review of consultants reports, and collaboration with appropriate care team members and the patient's provider was performed as part of comprehensive patient evaluation and provision of care management services. Telephone outreach to patient today to introduce CM services.   SDOH (Social Determinants of Health) screening was not performed today as the patients grandson reported wanting to speak with his mother, the patients daughter, prior to identifying challenges or needs.    Outpatient Encounter Medications as of 01/03/2019  Medication Sig  . amLODipine (NORVASC) 5 MG tablet TAKE 1 TABLET BY MOUTH EVERY DAY  . Calcium Carbonate-Vitamin D (CALCIUM 600+D) 600-400 MG-UNIT per tablet Take 1 tablet by mouth 3 (three) times daily with meals.   . Cholecalciferol (VITAMIN D PO) Take 1 tablet by mouth daily.  . diclofenac sodium (VOLTAREN) 1 % GEL Apply 2 g topically 4 (four) times daily. (Patient taking differently: Apply 2 g topically as needed. )  . FERRO-SEQUELS 65-25 MG TBCR Take 1 tablet by mouth daily.  . ferrous sulfate 325 (65 FE) MG tablet Take 1 tablet (325 mg total) by mouth daily.  . hydrochlorothiazide (HYDRODIURIL) 25 MG tablet TAKE 1 TABLET EVERY DAY  . KLOR-CON 10 10 MEQ tablet Take 10 mEq by mouth daily.  Marland Kitchen loratadine (CLARITIN) 10 MG tablet TAKE 1 TABLET BY MOUTH EVERY DAY (Patient not taking: Reported on 12/27/2018)  . lubiprostone (AMITIZA) 8 MCG capsule Take 1 capsule (8 mcg total) by mouth 2 (two) times daily with a meal.  . simvastatin (ZOCOR) 20 MG tablet TAKE 1 TABLET EVERY DAY   No facility-administered encounter medications on file as of 01/03/2019.     I reached out to Brett Mora by phone today. I spoke with  the patients caregiver and grandson Brett Mora.  [name] was given information about Chronic Care Management services today and how it would service the patient including:  1. CCM service includes personalized support from designated clinical staff supervised by his physician, including individualized plan of care and coordination with other care providers 2. 24/7 contact phone numbers for assistance for urgent and routine care needs. 3. Service will only be billed when office clinical staff spend 20 minutes or more in a month to coordinate care. 4. Only one practitioner may furnish and bill the service in a calendar month. 5. The patient may stop CCM services at any time (effective at the end of the month) by phone call to the office staff. 6. The patient will be responsible for cost sharing (co-pay) of up to 20% of the service fee (after annual deductible is met).   Verbal consent received from the patients caregiver to enroll in CCM services.  Follow Up Plan: CCM SW will follow up with the patient over the next two weeks to complete SDOH screen. CCM SW has communicated patient enrollment to embedded PharmD and RN Case Manager who will follow up with the patient over the next 30 days.   Brett Mora, BSW, CDP Social Worker, Certified Dementia Practitioner [place] [facility] [facility] [phone]

## 2019-01-03 NOTE — Patient Instructions (Signed)
Social Worker Visit Information    Brett Mora was given information about Chronic Care Management services today including:  1. CCM service includes personalized support from designated clinical staff supervised by his physician, including individualized plan of care and coordination with other care providers 2. 24/7 contact phone numbers for assistance for urgent and routine care needs. 3. Service will only be billed when office clinical staff spend 20 minutes or more in a month to coordinate care. 4. Only one practitioner may furnish and bill the service in a calendar month. 5. The patient may stop CCM services at any time (effective at the end of the month) by phone call to the office staff. 6. The patient will be responsible for cost sharing (co-pay) of up to 20% of the service fee (after annual deductible is met).  Patient agreed to services and verbal consent obtained.   The patient verbalized understanding of instructions provided today and declined a print copy of patient instruction materials.   Follow up plan: CCM SW will follow up with the patient over the next two weeks to conduct SDOH screening call.   Daneen Schick, BSW, CDP Social Worker, Certified Dementia Practitioner [facility] [phone]

## 2019-01-05 ENCOUNTER — Encounter: Payer: Self-pay | Admitting: Nurse Practitioner

## 2019-01-08 ENCOUNTER — Other Ambulatory Visit: Payer: Self-pay

## 2019-01-08 ENCOUNTER — Other Ambulatory Visit: Payer: Medicare Other | Admitting: Adult Health Nurse Practitioner

## 2019-01-08 ENCOUNTER — Telehealth: Payer: Self-pay

## 2019-01-08 DIAGNOSIS — Z515 Encounter for palliative care: Secondary | ICD-10-CM | POA: Diagnosis not present

## 2019-01-08 NOTE — Progress Notes (Signed)
[facility] Consult Note Telephone: [phone]  Fax: [phone]  PATIENT NAME: Brett Mora DOB: 1919/11/21 MRN: [id number]  PRIMARY CARE PROVIDER:   Minette Brine, FNP  REFERRING PROVIDER:  Minette Brine, [address] [place],  [place] [address]  RESPONSIBLE PARTY:   Self and Talmage Nap [phone] [name], daughter  [phone] Tonya Kopas, daughter H: [phone]  C: [phone]       RECOMMENDATIONS and PLAN:  1.  Advanced care planning.  Patient is a DNR.  Have discussed with family today keeping him at home as long as possible.  He refuses to go to doctors' appointments frequently, especially specialty doctors like urology and neurology.  He has prostate cancer and has not gone to urology for over a year.  Discussed that if anything was found how aggressive do they want treatments to be considering his age.  They are in agreement that they would not do aggressive treatments and are okay if does not want to go to the specialists.    2.  Possible TIA.  Patient had a near fall on 8/19 in which his left leg got weak and he yelled out for help leaving the bathroom.  His grandson was able to catch him and lower him to the floor.  Had telehealth appointment with PCP and believed to be possible TIA.  He has no facial drooping, EOMs intact, no slurred speech today.  He does have weakness in his left leg.  Home health PT was ordered by PCP on 8/20 but family states that no one has come or called them about it.  Left VM at doctor's office to see what can be done to get the PT started.  Grandson who takes care of him at home states that he has been weaker and that he is requiring more care.  Would like to know what resources he can seek to get some help in the home.  Have reached out SW to call family with resources.  3.  Allergies.  Family states that he frequently asks for TheraFlu for cold like symptoms, such as  nasal congestion and cough.  Feel like he is more having allergy symptoms than cold symptoms and have encouraged the use of OTC allergy medicine like Zyrtec or Claritin to help instead of TheraFlu  4.  Appetite.  Patient does appear as if he has lost some weight since last visit a month ago.  Do not have scales in the home to monitor weight.  Family did say that after the fall his appetite was decreased for a about 2 weeks and it is now starting to pick up.  Family concerned that it could be his cancer causing the weight loss.  Suggested that it could just be the weakness and subsequent decreased appetite.  Suggested that we monitor for weight gain now that his appetite is starting to pick up.  Continue monitoring for signs of weight gain and any changes in appetite.    I spent 60 minutes providing this consultation,  from 11:00 to 12:00. More than 50% of the time in this consultation was spent coordinating communication.   HISTORY OF PRESENT ILLNESS:  Brett Mora is a 85 y.o. year old male with multiple medical problems including HLD, HTN, iron deficiency anemia, left sided weakness post CVA. Palliative Care was asked to help address goals of care.   CODE STATUS: DNR  PPS: 50% HOSPICE ELIGIBILITY/DIAGNOSIS: TBD  PHYSICAL EXAM:  BP  126/58  HR 62  O2 sat 96% on RA General: patient sitting in chair in NAD; does appear as if he has lost some weight Cardiovascular: regular rate and rhythm Pulmonary: lung sounds clear; normal respiratory effort Abdomen: soft, nontender, + bowel sounds GU: no suprapubic tenderness Extremities: no edema, no joint deformities Skin: no rashes Neurological: Weakness especially noted in the legs; A&O x3   PAST MEDICAL HISTORY:  Past Medical History:  Diagnosis Date  . Blood transfusion without reported diagnosis   . Hyperlipemia   . Hypertension   . Iron deficiency   . Stroke [facility])     SOCIAL HX:  Social History   Tobacco Use  . Smoking status: Former  Research scientist (life sciences)  . Smokeless tobacco: Never Used  Substance Use Topics  . Alcohol use: No    ALLERGIES: No Known Allergies   PERTINENT MEDICATIONS:  Outpatient Encounter Medications as of 01/08/2019  Medication Sig  . amLODipine (NORVASC) 5 MG tablet TAKE 1 TABLET BY MOUTH EVERY DAY  . Calcium Carbonate-Vitamin D (CALCIUM 600+D) 600-400 MG-UNIT per tablet Take 1 tablet by mouth 3 (three) times daily with meals.   . Cholecalciferol (VITAMIN D PO) Take 1 tablet by mouth daily.  . diclofenac sodium (VOLTAREN) 1 % GEL Apply 2 g topically 4 (four) times daily. (Patient taking differently: Apply 2 g topically as needed. )  . FERRO-SEQUELS 65-25 MG TBCR Take 1 tablet by mouth daily.  . ferrous sulfate 325 (65 FE) MG tablet Take 1 tablet (325 mg total) by mouth daily.  . hydrochlorothiazide (HYDRODIURIL) 25 MG tablet TAKE 1 TABLET EVERY DAY  . KLOR-CON 10 10 MEQ tablet Take 10 mEq by mouth daily.  Marland Kitchen loratadine (CLARITIN) 10 MG tablet TAKE 1 TABLET BY MOUTH EVERY DAY (Patient not taking: Reported on 12/27/2018)  . lubiprostone (AMITIZA) 8 MCG capsule Take 1 capsule (8 mcg total) by mouth 2 (two) times daily with a meal.  . simvastatin (ZOCOR) 20 MG tablet TAKE 1 TABLET EVERY DAY   No facility-administered encounter medications on file as of 01/08/2019.      Taiyana Kissler Jenetta Downer, NP

## 2019-01-08 NOTE — Telephone Encounter (Signed)
Amy Olena Heckle a Nurse Practitioner from [facility] called stating the family has not heard back about home health PT and she wanted to know the status. I have returned her call and advised her the referral had just been put in on the 29th and it takes at least 7-10 business days to be completed she stated she would notify the family. YRL,RMA

## 2019-01-09 ENCOUNTER — Other Ambulatory Visit: Payer: Medicare Other | Admitting: Licensed Clinical Social Worker

## 2019-01-09 ENCOUNTER — Telehealth: Payer: Self-pay | Admitting: Adult Health Nurse Practitioner

## 2019-01-09 DIAGNOSIS — Z515 Encounter for palliative care: Secondary | ICD-10-CM

## 2019-01-09 NOTE — Telephone Encounter (Signed)
Called patient's daughter in reference to return call from PCP office about PT services.  The order was placed on 8/29 and that it could take 7-10 days for the referral to go through.  Daughter expressed understanding that it would be about next week when they should hear from the home health agency.  Jamika at  PCP office was not sure which [facility] agency would be picking up the patient. Amy K. Olena Heckle NP

## 2019-01-09 NOTE — Progress Notes (Signed)
COMMUNITY PALLIATIVE CARE SW NOTE  PATIENT NAME: Brett Mora DOB: 1920-04-09 MRN: [id number]  PRIMARY CARE PROVIDER: Minette Brine, FNP  RESPONSIBLE PARTY:  Acct ID - Guarantor Home Phone Work Phone Relationship Worth Type  [id number] [name], [name] [phone]  Self P/F     [address], [place], [place] [address]   Due to the COVID-19 crisis, this virtual check-in visit was done via telephone from my office and it was initiated and consent given by thispatientand or family.  PLAN OF CARE and INTERVENTIONS:             1. GOALS OF CARE/ ADVANCE CARE PLANNING:  Goal is for patient to remain at home with his grandson.  He has a DNR. 2. SOCIAL/EMOTIONAL/SPIRITUAL ASSESSMENT/ INTERVENTIONS:  SW conducted a Sales executive visit with patient's daughter, Brett Mora, per the request of Brett Ben, NP.  Patient had a fall and experienced increased weakness.  Brett Mora feels he will need additional help with ADLs in the home in the future.  Verified that patient receives assistance from the [place].  He also has Medicaid.  SW encouraged Brett Mora to contact the [facility] and inquire about aide services in the home.  Also asked her to contact the Department of Social Services to also discuss aide services.  She stated she understood. 3. PATIENT/CAREGIVER EDUCATION/ COPING:  Patient and his family cope by expressing their feelings openly. 4. PERSONAL EMERGENCY PLAN:  Family will call EMS. 5. COMMUNITY RESOURCES COORDINATION/ HEALTH CARE NAVIGATION:  None. 6. FINANCIAL/LEGAL CONCERNS/INTERVENTIONS:  Patient is on a fixed income.     SOCIAL HX:  Social History   Tobacco Use  . Smoking status: Former Research scientist (life sciences)  . Smokeless tobacco: Never Used  Substance Use Topics  . Alcohol use: No    CODE STATUS:  DNR ADVANCED DIRECTIVES: N MOST FORM COMPLETE:  N HOSPICE EDUCATION PROVIDED: N Duration of visit and documentation:  30 minutes.      Brett Corn Dea Bitting, LCSW

## 2019-01-09 NOTE — Telephone Encounter (Signed)
Be sure [facility] faxes the referral. thanks

## 2019-01-10 ENCOUNTER — Other Ambulatory Visit: Payer: Self-pay

## 2019-01-15 ENCOUNTER — Telehealth: Payer: Self-pay

## 2019-01-15 ENCOUNTER — Other Ambulatory Visit: Payer: Self-pay

## 2019-01-15 DIAGNOSIS — K5900 Constipation, unspecified: Secondary | ICD-10-CM

## 2019-01-15 MED ORDER — LUBIPROSTONE 8 MCG PO CAPS: 8.0000 ug | ORAL_CAPSULE | Freq: Two times a day (BID) | ORAL | 2 refills | Status: AC

## 2019-01-18 ENCOUNTER — Ambulatory Visit: Payer: Medicare Other

## 2019-01-18 DIAGNOSIS — I10 Essential (primary) hypertension: Secondary | ICD-10-CM

## 2019-01-18 NOTE — Chronic Care Management (AMB) (Signed)
  Chronic Care Management    Social Work Follow Up Note  01/18/2019 Name: Brett Mora MRN: [id number] DOB: 07/01/1919  Brett Mora is a 85 y.o. year old male who is a primary care patient of Minette Brine, [place]. The CCM team was consulted for assistance with Brett Mora.  Review of patient status, including review of consultants reports, other relevant assessments, and collaboration with appropriate care team members and the patient's provider was performed as part of comprehensive patient evaluation and provision of chronic care management services.    I placed a follow up call to the patients home to conduct an SDOH screen. I spoke with the patients grandson and caregiver [name]. [name] declines SW assistance stating no challenges at this time.  SDOH (Social Determinants of Health) screening performed today: None.   Outpatient Encounter Medications as of 01/18/2019  Medication Sig  . amLODipine (NORVASC) 5 MG tablet TAKE 1 TABLET BY MOUTH EVERY DAY  . Calcium Carbonate-Vitamin D (CALCIUM 600+D) 600-400 MG-UNIT per tablet Take 1 tablet by mouth 3 (three) times daily with meals.   . Cholecalciferol (VITAMIN D PO) Take 1 tablet by mouth daily.  . diclofenac sodium (VOLTAREN) 1 % GEL Apply 2 g topically 4 (four) times daily. (Patient taking differently: Apply 2 g topically as needed. )  . FERRO-SEQUELS 65-25 MG TBCR Take 1 tablet by mouth daily.  . ferrous sulfate 325 (65 FE) MG tablet Take 1 tablet (325 mg total) by mouth daily.  . hydrochlorothiazide (HYDRODIURIL) 25 MG tablet TAKE 1 TABLET EVERY DAY  . KLOR-CON 10 10 MEQ tablet Take 10 mEq by mouth daily.  Marland Kitchen loratadine (CLARITIN) 10 MG tablet TAKE 1 TABLET BY MOUTH EVERY DAY (Patient not taking: Reported on 12/27/2018)  . lubiprostone (Brett Mora) 8 MCG capsule Take 1 capsule (8 mcg total) by mouth 2 (two) times daily with a meal.  . simvastatin (ZOCOR) 20 MG tablet TAKE 1 TABLET EVERY DAY   No facility-administered encounter medications on file as  of 01/18/2019.      Follow Up Plan: No planned SW follow up at this time. The patient will be outreached by RN Case Manager and embedded PharmD at a later time. SW to route note to team for collaboration.   Daneen Schick, BSW, CDP Social Worker, Certified Dementia Practitioner [facility] [place] [facility] [phone]

## 2019-01-22 ENCOUNTER — Other Ambulatory Visit: Payer: Self-pay | Admitting: Nurse Practitioner

## 2019-01-23 ENCOUNTER — Telehealth: Payer: Medicare Other

## 2019-01-25 ENCOUNTER — Telehealth: Payer: Self-pay

## 2019-01-30 ENCOUNTER — Ambulatory Visit: Payer: Self-pay

## 2019-01-30 DIAGNOSIS — I10 Essential (primary) hypertension: Secondary | ICD-10-CM

## 2019-01-30 DIAGNOSIS — I69354 Hemiplegia and hemiparesis following cerebral infarction affecting left non-dominant side: Secondary | ICD-10-CM

## 2019-01-30 DIAGNOSIS — E782 Mixed hyperlipidemia: Secondary | ICD-10-CM

## 2019-01-31 ENCOUNTER — Ambulatory Visit (INDEPENDENT_AMBULATORY_CARE_PROVIDER_SITE_OTHER): Payer: Medicare Other

## 2019-01-31 ENCOUNTER — Telehealth: Payer: Self-pay

## 2019-01-31 ENCOUNTER — Emergency Department (HOSPITAL_COMMUNITY)
Admission: EM | Admit: 2019-01-31 | Discharge: 2019-01-31 | Disposition: A | Discharge status: Home / Self Care | Payer: Medicare Other | Attending: Emergency Medicine | Admitting: Emergency Medicine

## 2019-01-31 ENCOUNTER — Emergency Department (HOSPITAL_COMMUNITY): Payer: Medicare Other

## 2019-01-31 ENCOUNTER — Other Ambulatory Visit: Payer: Self-pay

## 2019-01-31 ENCOUNTER — Encounter (HOSPITAL_COMMUNITY): Payer: Self-pay | Admitting: *Deleted

## 2019-01-31 DIAGNOSIS — Z87891 Personal history of nicotine dependence: Secondary | ICD-10-CM | POA: Diagnosis not present

## 2019-01-31 DIAGNOSIS — I10 Essential (primary) hypertension: Secondary | ICD-10-CM | POA: Insufficient documentation

## 2019-01-31 DIAGNOSIS — M25512 Pain in left shoulder: Secondary | ICD-10-CM

## 2019-01-31 DIAGNOSIS — R404 Transient alteration of awareness: Secondary | ICD-10-CM | POA: Diagnosis not present

## 2019-01-31 DIAGNOSIS — R296 Repeated falls: Secondary | ICD-10-CM | POA: Diagnosis not present

## 2019-01-31 DIAGNOSIS — W19XXXA Unspecified fall, initial encounter: Secondary | ICD-10-CM | POA: Diagnosis not present

## 2019-01-31 DIAGNOSIS — Z8673 Personal history of transient ischemic attack (TIA), and cerebral infarction without residual deficits: Secondary | ICD-10-CM | POA: Insufficient documentation

## 2019-01-31 DIAGNOSIS — M25519 Pain in unspecified shoulder: Secondary | ICD-10-CM | POA: Diagnosis not present

## 2019-01-31 DIAGNOSIS — Z79899 Other long term (current) drug therapy: Secondary | ICD-10-CM | POA: Diagnosis not present

## 2019-01-31 DIAGNOSIS — E782 Mixed hyperlipidemia: Secondary | ICD-10-CM

## 2019-01-31 DIAGNOSIS — R52 Pain, unspecified: Secondary | ICD-10-CM | POA: Diagnosis not present

## 2019-01-31 DIAGNOSIS — I69354 Hemiplegia and hemiparesis following cerebral infarction affecting left non-dominant side: Secondary | ICD-10-CM

## 2019-01-31 NOTE — Chronic Care Management (AMB) (Signed)
Chronic Care Management   Initial Visit Note  01/30/2019 Name: Brett Mora MRN: [id number] DOB: March 16, 1920  Referred by: Brett Brine, FNP Reason for referral : Chronic Care Management ([facility] )   Brett Mora is a 85 y.o. year old male who is a primary care patient of Brett Mora, [place]. The CCM team was consulted for assistance with chronic disease management and care coordination needs.   Review of patient status, including review of consultants reports, relevant laboratory and other test results, and collaboration with appropriate care team members and the patient's provider was performed as part of comprehensive patient evaluation and provision of chronic care management services.    SDOH (Social Determinants of Health) screening performed today: None. See Care Plan for related entries.   Advanced Directives Status: N See Care Plan and [facility] application for related entries.   I spoke with patient's grandson Brett Mora by telephone to assess for CCM RN needs and a care plan was established.   Medications: Outpatient Encounter Medications as of 01/30/2019  Medication Sig  . amLODipine (NORVASC) 5 MG tablet TAKE 1 TABLET BY MOUTH EVERY DAY (Patient taking differently: Take 5 mg by mouth daily. )  . Calcium Carbonate-Vitamin D (CALCIUM 600+D) 600-400 MG-UNIT per tablet Take 1 tablet by mouth 3 (three) times daily with meals.   . Cholecalciferol (VITAMIN D PO) Take 1 tablet by mouth daily.  . diclofenac sodium (VOLTAREN) 1 % GEL Apply 2 g topically 4 (four) times daily. (Patient taking differently: Apply 2 g topically as needed (pain). )  . ferrous sulfate 325 (65 FE) MG tablet Take 1 tablet (325 mg total) by mouth daily.  . hydrochlorothiazide (HYDRODIURIL) 25 MG tablet TAKE 1 TABLET BY MOUTH EVERY DAY  . loratadine (CLARITIN) 10 MG tablet TAKE 1 TABLET BY MOUTH EVERY DAY  . lubiprostone (AMITIZA) 8 MCG capsule Take 1 capsule (8 mcg total) by mouth 2 (two) times  daily with a meal. (Patient not taking: Reported on 01/31/2019)  . simvastatin (ZOCOR) 20 MG tablet TAKE 1 TABLET EVERY DAY (Patient taking differently: Take 20 mg by mouth daily at 6 PM. )  . [DISCONTINUED] FERRO-SEQUELS 65-25 MG TBCR Take 1 tablet by mouth daily.  . [DISCONTINUED] KLOR-CON 10 10 MEQ tablet Take 10 mEq by mouth daily.   No facility-administered encounter medications on file as of 01/30/2019.      Objective:  Lab Results  Component Value Date   HGBA1C 6.1 (H) 07/12/2013   Lab Results  Component Value Date   LDLCALC 80 05/16/2018   CREATININE 1.12 05/16/2018   BP Readings from Last 3 Encounters:  01/31/19 103/78  07/02/18 126/72  06/04/18 122/60    Goals Addressed    . High Risk for Falls       Current Barriers:  . High Risk for Falls . Self Care Deficit . Impaired Physical Mobility secondary to potential CVA/TIA  Nurse Case Manager Clinical Goal(s):  Marland Kitchen Over the next 30 days, patient will work with CCM RN to address needs related to home safety and or durable medical equipment needed to help reduce the risk for falls  Interventions:  01/30/19 call completed with granson Brett Mora   . Assessed for home safety concerns and or durable medical equipment needs . Assessed for progressive physical debility secondary to patient's Cancer diagnosis and or there comorbidities that may potentially increase his risk for falls and or injury . Discussed patient may benefit from having a HSE . Evaluation of current  treatment plan related to impaired physical mobility and patient's adherence to plan as established by provider. . Advised grandson Brett Mora to notify the CCM team and or PCP of any/all falls promptly  . Discussed plans with grandson for ongoing care management follow up and provided patient with direct contact information for care management team  Patient Self Care Activities:  . Currently UNABLE TO independently perform Self Care  Initial goal documentation     .  My grandfather may have had a stroke       Current Barriers:  Marland Kitchen Knowledge Deficits related to disease process, diagnosis and treatment management of Cerebral Infarction  . High Risk for injury secondary to potential Cerebral Vascular Accident  Nurse Case Manager Clinical Goal(s):  Marland Kitchen Over the next 30 days, patient will experience no symptoms suggestive of TIA and or CVA  . Over the next 90 days, patient will not experience any ED events and or IP admissions   CCM RN CM Interventions:  01/30/19 call completed with grandson Brett Mora  . Evaluation of current treatment plan related to potential CVA/TIA and patient's adherence to plan as established by provider . Advised grandson Brett Mora to call 911 if signs/symptoms of CVA are suspected; reviewed FAST acronym with grandson Brett Mora; discussed the potential causes of a Stroke including Ischemic and or Hemorrhagic  . Reviewed medications with grandson and discussed that patient is not currently prescribed anticoagulant or Aspirin therapy; discussed the family does not wish to administer any new medications to the patient  . Discussed plans with grandson for ongoing care management follow up and provided patient with direct contact information for care management team . Provided printed educational materials related to FAST brochure to be mailed to patients home - encouraged grandson to post this information in a visual area for the family to reference if needed  Patient Self Care Activities:  . Currently UNABLE TO independently unable to perform self care  Initial goal documentation        Plan:   Telephone follow up appointment with care management team member scheduled for: 03/01/19  Barb Merino, RN, BSN, CCM Care Management Coordinator [facility]  Direct Phone: [phone]

## 2019-01-31 NOTE — ED Triage Notes (Signed)
Per EMS, pt from home, daughter reports several falls lately.  Hx of Dementia.  Pt is alert and oriented per his baseline.  He is c/o L shoulder pain.

## 2019-01-31 NOTE — Patient Instructions (Signed)
Visit Information  Goals Addressed    . High Risk for Falls       Current Barriers:  . High Risk for Falls . Self Care Deficit . Impaired Physical Mobility secondary to potential CVA/TIA  Nurse Case Manager Clinical Goal(s):  Marland Kitchen Over the next 30 days, patient will work with CCM RN to address needs related to home safety and or durable medical equipment needed to help reduce the risk for falls  Interventions:  01/30/19 call completed with granson [name]   . Assessed for home safety concerns and or durable medical equipment needs . Assessed for progressive physical debility secondary to patient's Cancer diagnosis and or there comorbidities that may potentially increase his risk for falls and or injury . Discussed patient may benefit from having a HSE . Evaluation of current treatment plan related to impaired physical mobility and patient's adherence to plan as established by provider. . Advised grandson [name] to notify the CCM team and or PCP of any/all falls promptly  . Discussed plans with grandson for ongoing care management follow up and provided patient with direct contact information for care management team  Patient Self Care Activities:  . Currently UNABLE TO independently perform Self Care  Initial goal documentation     . My grandfather may have had a stroke       Current Barriers:  Marland Kitchen Knowledge Deficits related to disease process, diagnosis and treatment management of Cerebral Infarction  . High Risk for injury secondary to potential Cerebral Vascular Accident  Nurse Case Manager Clinical Goal(s):  Marland Kitchen Over the next 30 days, patient will experience no symptoms suggestive of TIA and or CVA  . Over the next 90 days, patient will not experience any ED events and or IP admissions   CCM RN CM Interventions:  01/30/19 call completed with grandson [name]  . Evaluation of current treatment plan related to potential CVA/TIA and patient's adherence to plan as established by  provider . Advised grandson [name] to call 911 if signs/symptoms of CVA are suspected; reviewed FAST acronym with grandson [name]; discussed the potential causes of a Stroke including Ischemic and or Hemorrhagic  . Reviewed medications with grandson and discussed that patient is not currently prescribed anticoagulant or Aspirin therapy; discussed the family does not wish to administer any new medications to the patient  . Discussed plans with grandson for ongoing care management follow up and provided patient with direct contact information for care management team . Provided printed educational materials related to FAST brochure to be mailed to patients home - encouraged grandson to post this information in a visual area for the family to reference if needed  Patient Self Care Activities:  . Currently UNABLE TO independently unable to perform self care  Initial goal documentation        The patient verbalized understanding of instructions provided today and declined a print copy of patient instruction materials.   Telephone follow up appointment with care management team member scheduled for: 03/01/19  Barb Merino, RN, BSN, CCM Care Management Coordinator [facility]  Direct Phone: [phone]

## 2019-01-31 NOTE — Discharge Instructions (Signed)
As discussed, today's evaluation has been generally reassuring. There is no evidence for broken bones, but there is some evidence for a rotator cuff injury that you have sustained at some point in the past. Please follow-up with our orthopedic colleagues for further therapy.  In the interim, please use ibuprofen or Tylenol for pain control.  Return here for concerning changes in your condition.

## 2019-01-31 NOTE — Patient Instructions (Signed)
Visit Information  Goals Addressed    . My grandfather may have had a stroke       Current Barriers:  Marland Kitchen Knowledge Deficits related to disease process, diagnosis and treatment management of Cerebral Infarction  . High Risk for injury secondary to potential Cerebral Vascular Accident  Nurse Case Manager Clinical Goal(s):  Marland Kitchen Over the next 30 days, patient will experience no symptoms suggestive of TIA and or CVA  . Over the next 90 days, patient will not experience any ED events and or IP admissions   CCM RN CM Interventions:  01/30/19 call completed with grandson [name]  . Evaluation of current treatment plan related to potential CVA/TIA and patient's adherence to plan as established by provider . Advised grandson [name] to call 911 if signs/symptoms of CVA are suspected; reviewed FAST acronym with grandson [name]; discussed the potential causes of a Stroke including Ischemic and or Hemorrhagic  . Reviewed medications with grandson and discussed that patient is not currently prescribed anticoagulant or Aspirin therapy; discussed the family does not wish to administer any new medications to the patient  . Discussed plans with grandson for ongoing care management follow up and provided patient with direct contact information for care management team . Provided printed educational materials related to FAST brochure to be mailed to patients home - encouraged grandson to post this information in a visual area for the family to reference if needed  01/31/19 Call completed with daughter [name]   . Inbound call received from patient's daughter [name] stating Mr. Feingold had another fall in his home while in his bathroom . Discussed the patient fell to the floor and the family was present and was able to get him lifted from the floor although with great difficulty . Discussed patient is alert and oriented and denies injury; discussed patient states the right side of his body became weak right before  the fall and this was similar to what he experienced with the last fall 3 weeks ago . Instructed daughter [name] that Mr. Kellum may have experienced another Stroke and or may have an injury from the fall and should be evaluated in the ED  . Instructed daughter [name] to call 88 and have Mr. Rajaram transported via EMS - informed daughter a CVA is a medical emergency and in some cases time sensitive in order to receive medical attention within a short window of time to receive what may be life saving measures - daughter Cristine Polio understanding and plans to activate EMS now . In basket message sent to provider Minette Brine FNP with notification   Patient Self Care Activities:  . Currently UNABLE TO independently unable to perform self care  Please see past updates related to this goal by clicking on the "Past Updates" button in the selected goal         The patient verbalized understanding of instructions provided today and declined a print copy of patient instruction materials.   Telephone follow up appointment with care management team member scheduled for: 02/01/19  Barb Merino, RN, BSN, CCM Care Management Coordinator [facility]  Direct Phone: [phone]

## 2019-01-31 NOTE — Chronic Care Management (AMB) (Addendum)
Chronic Care Management   Follow Up Note   01/31/2019 Name: Brett Mora MRN: [id number] DOB: February 28, 1920  Referred by: Minette Brine, FNP Reason for referral : Chronic Care Management (CCM RNCM Telephone Collaboration )   Brett Mora is a 85 y.o. year old male who is a primary care patient of Minette Brine, [place]. The CCM team was consulted for assistance with chronic disease management and care coordination needs.    Review of patient status, including review of consultants reports, relevant laboratory and other test results, and collaboration with appropriate care team members and the patient's provider was performed as part of comprehensive patient evaluation and provision of chronic care management services.    SDOH (Social Determinants of Health) screening performed today: None. See Care Plan for related entries.   Advanced Directives Status: N See Care Plan and Vynca application for related entries.  I received an inbound call from Brett Mora Brett Mora daughter today to discuss Brett Mora experienced a fall while in his home today and will need to transport to the ED via EMS for further evaluation.   Outpatient Encounter Medications as of 01/31/2019  Medication Sig  . acetaminophen (TYLENOL) 325 MG tablet Take 650 mg by mouth every 6 (six) hours as needed for mild pain or headache.  Marland Kitchen amLODipine (NORVASC) 5 MG tablet TAKE 1 TABLET BY MOUTH EVERY DAY (Patient taking differently: Take 5 mg by mouth daily. )  . Calcium Carbonate-Vitamin D (CALCIUM 600+D) 600-400 MG-UNIT per tablet Take 1 tablet by mouth 3 (three) times daily with meals.   . Cholecalciferol (VITAMIN D PO) Take 1 tablet by mouth daily.  . diclofenac sodium (VOLTAREN) 1 % GEL Apply 2 g topically 4 (four) times daily. (Patient taking differently: Apply 2 g topically as needed (pain). )  . ferrous sulfate 325 (65 FE) MG tablet Take 1 tablet (325 mg total) by mouth daily.  . hydrochlorothiazide (HYDRODIURIL) 25 MG tablet TAKE 1  TABLET BY MOUTH EVERY DAY  . loratadine (CLARITIN) 10 MG tablet TAKE 1 TABLET BY MOUTH EVERY DAY  . lubiprostone (AMITIZA) 8 MCG capsule Take 1 capsule (8 mcg total) by mouth 2 (two) times daily with a meal. (Patient not taking: Reported on 01/31/2019)  . simvastatin (ZOCOR) 20 MG tablet TAKE 1 TABLET EVERY DAY (Patient taking differently: Take 20 mg by mouth daily at 6 PM. )   No facility-administered encounter medications on file as of 01/31/2019.      Goals Addressed    . My grandfather may have had a stroke       Current Barriers:  Marland Kitchen Knowledge Deficits related to disease process, diagnosis and treatment management of Cerebral Infarction  . High Risk for injury secondary to potential Cerebral Vascular Accident  Nurse Case Manager Clinical Goal(s):  Marland Kitchen Over the next 30 days, patient will experience no symptoms suggestive of TIA and or CVA  . Over the next 90 days, patient will not experience any ED events and or IP admissions   CCM RN CM Interventions:  01/30/19 call completed with grandson Brett Mora  . Evaluation of current treatment plan related to potential CVA/TIA and patient's adherence to plan as established by provider . Advised grandson Brett Mora to call 911 if signs/symptoms of CVA are suspected; reviewed FAST acronym with grandson Brett Mora; discussed the potential causes of a Stroke including Ischemic and or Hemorrhagic  . Reviewed medications with grandson and discussed that patient is not currently prescribed anticoagulant or Aspirin therapy; discussed the family does not  wish to administer any new medications to the patient  . Discussed plans with grandson for ongoing care management follow up and provided patient with direct contact information for care management team . Provided printed educational materials related to FAST brochure to be mailed to patients home - encouraged grandson to post this information in a visual area for the family to reference if needed  01/31/19 Call completed  with daughter Brett Mora   . Inbound call received from patient's daughter Brett Mora stating Brett Mora had another fall in his home while in his bathroom . Discussed the patient fell to the floor and landed on his left arm/shoulder; the family was present and was able to get him lifted from the floor although with great difficulty . Discussed patient is alert and oriented and denies injury; discussed patient states the left side of his body became weak right before the fall and this was similar to what he experienced with the last fall 3 weeks ago . Instructed daughter Brett Mora that Brett Mora may have experienced another Stroke and or may have an injury from the fall and should be evaluated in the ED  . Instructed daughter Brett Mora to call [phone] and have Brett Mora transported via EMS - informed daughter a CVA is a medical emergency and in some cases time sensitive in order to receive medical attention within a short window of time to receive what may be life saving measures - daughter Cristine Polio understanding and plans to activate EMS now . In basket message sent to provider Minette Brine FNP with notification   Patient Self Care Activities:  . Currently UNABLE TO independently unable to perform self care  Please see past updates related to this goal by clicking on the "Past Updates" button in the selected goal          Telephone follow up appointment with care management team member scheduled for: 02/01/19   Barb Merino, RN, BSN, CCM Care Management Coordinator [place] [facility]  Direct Phone: [phone]

## 2019-01-31 NOTE — ED Provider Notes (Signed)
Brett Mora Provider Note   CSN: [id number] Arrival date & time: 01/31/19  1329     History   Chief Complaint Chief Complaint  Patient presents with   Fall   Shoulder Pain    HPI Brett Mora is a 85 y.o. male.     HPI Patient presents from home via EMS. Patient does have history dementia, but provides details to today's event, seemingly reliably. Reportedly patient's dementia is mild. Patient has had a series of falls over the past weeks, per EMS, and today had another fall, with subsequent pain that developed in his left shoulder. Patient denies pain anywhere else, denies weakness in his arm beyond baseline, with a history of prior stroke. It is unclear if he has taken any medication for pain control. Per EMS, and few members the patient is interacting in a typical manner. Past Medical History:  Diagnosis Date   Blood transfusion without reported diagnosis    Hyperlipemia    Hypertension    Iron deficiency    Stroke [facility])     Patient Active Problem List   Diagnosis Date Noted   Abnormality of gait 07/23/2018   Hemiparesis affecting left side as late effect of cerebrovascular accident ([place]) 07/23/2018   Iron deficiency anemia 07/23/2018   Elevated PSA 07/23/2018   Mixed hyperlipidemia 07/13/2013   Solitary pulmonary nodule 07/13/2013   CVA (cerebral infarction) 07/11/2013   Essential hypertension 07/11/2013    Past Surgical History:  Procedure Laterality Date   BACK SURGERY          Home Medications    Prior to Admission medications   Medication Sig Start Date End Date Taking? Authorizing Provider  acetaminophen (TYLENOL) 325 MG tablet Take 650 mg by mouth every 6 (six) hours as needed for mild pain or headache.   Yes [provider]  amLODipine (NORVASC) 5 MG tablet TAKE 1 TABLET BY MOUTH EVERY DAY Patient taking differently: Take 5 mg by mouth daily.  12/05/18  Yes Minette Brine, FNP  Calcium  Carbonate-Vitamin D (CALCIUM 600+D) 600-400 MG-UNIT per tablet Take 1 tablet by mouth 3 (three) times daily with meals.    Yes [provider]  Cholecalciferol (VITAMIN D PO) Take 1 tablet by mouth daily.   Yes [provider]  diclofenac sodium (VOLTAREN) 1 % GEL Apply 2 g topically 4 (four) times daily. Patient taking differently: Apply 2 g topically as needed (pain).  05/16/18  Yes Minette Brine, FNP  ferrous sulfate 325 (65 FE) MG tablet Take 1 tablet (325 mg total) by mouth daily. 12/27/18 12/27/19 Yes Minette Brine, FNP  hydrochlorothiazide (HYDRODIURIL) 25 MG tablet TAKE 1 TABLET BY MOUTH EVERY DAY 01/22/19  Yes Minette Brine, FNP  loratadine (CLARITIN) 10 MG tablet TAKE 1 TABLET BY MOUTH EVERY DAY 12/04/18  Yes Minette Brine, FNP  simvastatin (ZOCOR) 20 MG tablet TAKE 1 TABLET EVERY DAY Patient taking differently: Take 20 mg by mouth daily at 6 PM.  08/13/18  Yes Minette Brine, FNP  lubiprostone (AMITIZA) 8 MCG capsule Take 1 capsule (8 mcg total) by mouth 2 (two) times daily with a meal. Patient not taking: Reported on 01/31/2019 01/15/19   Minette Brine, FNP    Family History No family history on file.  Social History Social History   Tobacco Use   Smoking status: Former Smoker   Smokeless tobacco: Never Used  Substance Use Topics   Alcohol use: No   Drug use: No     Allergies  Patient has no known allergies.   Review of Systems Review of Systems  Constitutional:       Per HPI, otherwise negative  HENT:       Per HPI, otherwise negative  Respiratory:       Per HPI, otherwise negative  Cardiovascular:       Per HPI, otherwise negative  Gastrointestinal: Negative for vomiting.  Endocrine:       Negative aside from HPI  Genitourinary:       Neg aside from HPI   Musculoskeletal:       Per HPI, otherwise negative  Skin: Negative for wound.  Neurological: Positive for weakness. Negative for syncope.  Psychiatric/Behavioral: Positive for confusion.      Physical Exam Updated Vital Signs BP 103/78 (BP Location: Right Arm)    Pulse 77    Temp 98.2 F (36.8 C) (Oral)    Resp 20    SpO2 97%   Physical Exam Vitals signs and nursing note reviewed.  Constitutional:      General: He is not in acute distress.    Appearance: He is well-developed.     Comments: Elderly male sitting upright awake, alert.  HENT:     Head: Normocephalic and atraumatic.  Eyes:     Conjunctiva/sclera: Conjunctivae normal.  Cardiovascular:     Rate and Rhythm: Normal rate and regular rhythm.     Pulses: Normal pulses.  Pulmonary:     Effort: Pulmonary effort is normal. No respiratory distress.     Breath sounds: No stridor.  Abdominal:     General: There is no distension.  Skin:    General: Skin is warm and dry.  Neurological:     Mental Status: He is alert.     Motor: Atrophy present.     Comments: Very poor hearing, speech is brief, clear, appropriate.  Patient states that he is weakness in the left arm, states that he had a stroke, but move the arm freely.  Psychiatric:        Cognition and Memory: Cognition is impaired.      ED Treatments / Results  Labs (all labs ordered are listed, but only abnormal results are displayed) Labs Reviewed - No data to display  EKG None  Radiology Dg Elbow Complete Left  Result Date: 01/31/2019 CLINICAL DATA:  Several falls recently.  Left elbow pain. EXAM: LEFT ELBOW - COMPLETE 3+ VIEW COMPARISON:  None. FINDINGS: Mild irregularity along the radial margin of the radial head, which is likely chronic. There is no associated soft tissue swelling. No convincing acute fracture. There is spurring from the margins of the ulna at the ulnar trochlear articulation. There is mild narrowing of the radiocapitellar joint. No joint effusion. IMPRESSION: 1. No convincing acute fracture.  No dislocation. 2. Degenerative changes.  No joint effusion. Electronically Signed   By: Lajean Manes M.D.   On: 01/31/2019 15:23   Dg  Shoulder Left  Result Date: 01/31/2019 CLINICAL DATA:  Several falls lately.  Left shoulder pain. EXAM: LEFT SHOULDER - 2+ VIEW COMPARISON:  None. FINDINGS: No convincing fracture.  No dislocation. Narrow glenohumeral joint with marginal osteophytes. There is also narrowing of the subacromial space with a subacromial spur. AC joint not well-defined but appears normally aligned. Soft tissues are unremarkable. IMPRESSION: 1. No fracture or dislocation. 2. Arthropathic changes of the glenohumeral joint. 3. Findings consistent with a chronic full-thickness rotator cuff tear with significant narrowing of the subacromial space as well as a subacromial spur.  Electronically Signed   By: Lajean Manes M.D.   On: 01/31/2019 15:21    Procedures Procedures (including critical care time)  Medications Ordered in ED Medications - No data to display   Initial Impression / Assessment and Plan / ED Course  I have reviewed the triage vital signs and the nursing notes.  Pertinent labs & imaging results that were available during my care of the patient were reviewed by me and considered in my medical decision making (see chart for details).        3:30 PM Patient in no distress, awake, alert. X-ray reassuring, minimal complaints. Patient does have shoulder pain, and x-rays are consistent with possible rotator cuff tear, otherwise reassuring. Without other complaints, patient was returned to his family, with orthopedics follow-up.  Final Clinical Impressions(s) / ED Diagnoses   Final diagnoses:  Fall, initial encounter  Left shoulder pain, unspecified chronicity     Carmin Muskrat, MD 01/31/19 1531

## 2019-02-01 ENCOUNTER — Telehealth: Payer: Medicare Other

## 2019-02-01 ENCOUNTER — Ambulatory Visit: Payer: Self-pay

## 2019-02-01 ENCOUNTER — Other Ambulatory Visit: Payer: Self-pay

## 2019-02-01 DIAGNOSIS — E782 Mixed hyperlipidemia: Secondary | ICD-10-CM | POA: Diagnosis not present

## 2019-02-01 DIAGNOSIS — I69354 Hemiplegia and hemiparesis following cerebral infarction affecting left non-dominant side: Secondary | ICD-10-CM

## 2019-02-01 DIAGNOSIS — I10 Essential (primary) hypertension: Secondary | ICD-10-CM | POA: Diagnosis not present

## 2019-02-01 NOTE — Patient Instructions (Addendum)
Visit Information  Goals Addressed    . High Risk for Falls       Current Barriers:  . High Risk for Falls . Self Care Deficit . Impaired Physical Mobility secondary to potential CVA/TIA  Nurse Case Manager Clinical Goal(s):  Marland Kitchen Over the next 30 days, patient will work with CCM RN to address needs related to home safety and or durable medical equipment needed to help reduce the risk for falls  Interventions:  02/01/19 call completed with granson [name]   . Assessed for possible cause for patient's fall occurring on 01/31/19; Assessed for home safety concerns and or durable medical equipment needs - patient is using a rolling walker and has a manual W/C, he has grab bars in the bathroom as well as a raised toliet seat . Assessed for progressive physical debility secondary to patient's Cancer diagnosis and or there comorbidities that may potentially increase his risk for falls and or injury - grandson states, "I just feel like his legs are really weak" . Discussed patient may benefit from having a HSE and re-evaluation for in home PT for strengthening and balance . Assessed for symptoms suggestive of Orthostatic Hypotension; discussed standing or changing positions too quickly can cause a sudden drop in the BP triggering loss of balance and or a fall; instructed grandson to remind his granddad to change positions slowly in order to allow time for his BP to adjust . Advised grandson [name] to continue to notify the CCM team and or PCP of    any/all falls promptly  . Discussed per patients imaging of his Left shoulder from ED visit on 01/31/19, patient has a possible rotator cuff tear and should follow up with Orthopedics if his pain continues- grandson was not aware but reiterated his granddad will most likely refuse to do so . Discussed plans with grandson for ongoing care management follow up and provided patient with direct contact information for care management team  02/01/19 Placed outbound  call to [place] [phone]  . Left a voice message for extension [phone], provided my name, contact number and the purpose of my call to determine if services were started for this patient following a referral sent by the PCP provider in August  Patient Self Care Activities:  . Currently UNABLE TO independently perform Self Care  Please see past updates related to this goal by clicking on the "Past Updates" button in the selected goal        Visit Information  Goals Addressed      Patient Stated   . "I need some help with caring for my granddad" (pt-stated)       Current Barriers:  Marland Kitchen Knowledge Deficits related to Caregiver Resources for Respite Care  . Lacks caregiver support.   Nurse Case Manager Clinical Goal(s):  Marland Kitchen Over the next 30 days, patient will work with the CCM team to address needs related to Caregiver Resources to assist with Respite care   CCM RN CM Interventions:  02/01/19 call completed with patient's grandson [name]  . Received an inbound call from patient's grandson and primary caregiver [name] stating he needs caregiver assistance for his granddad; discussed his family including his younger brother, his aunt and his mother are involved with Mr. Bodkins care but not readily available to relieve [name] when he needs respite care; discussed he and the family would like to know what their options are for this type of service . Reiterated contacting the [employer] personal discussed at last call to inquire about patient's eligible VA benefits . Discussed the embedded BSW Daneen Schick will contact him to explore options and provide the appropriate resources . Collaborated with Daneen Schick BSW regarding Mr. nicolo szablewski concerns and request for Caregiver resources . Discussed plans with patient for ongoing care management follow up and provided patient with direct contact information for care management team . Social Work referral  for embedded [place]   Patient Self Care Activities:  . Currently UNABLE TO independently perform self care  Initial goal documentation       Other   . High Risk for Falls       Current Barriers:  . High Risk for Falls . Self Care Deficit . Impaired Physical Mobility secondary to potential CVA/TIA  Nurse Case Manager Clinical Goal(s):  Marland Kitchen Over the next 30 days, patient will work with CCM RN to address needs related to home safety and or durable medical equipment needed to help reduce the risk for falls  Interventions:  02/01/19 call completed with granson [name]   . Assessed for possible cause for patient's fall occurring on 01/31/19; Assessed for home safety concerns and or durable medical equipment needs - patient is using a rolling walker and has a manual W/C, he has grab bars in the bathroom as well as a raised toliet seat . Assessed for progressive physical debility secondary to patient's Cancer diagnosis and or there comorbidities that may potentially increase his risk for falls and or injury - grandson states, "I just feel like his legs are really weak" . Discussed patient may benefit from having a HSE and re-evaluation for in home PT for strengthening and balance . Assessed for symptoms suggestive of Orthostatic Hypotension; discussed standing or changing positions too quickly can cause a sudden drop in the BP triggering loss of balance and or a fall; instructed grandson to remind his granddad to change positions slowly in order to allow time for his BP to adjust . Advised grandson [name] to continue to notify the CCM team and or PCP of    any/all falls promptly  . Discussed per patients imaging of his Left shoulder from ED visit on 01/31/19, patient has a possible rotator cuff tear and should follow up with Orthopedics if his pain continues- grandson was not aware but reiterated his granddad will most likely refuse to do so . Discussed plans with grandson for ongoing care  management follow up and provided patient with direct contact information for care management team  02/01/19 Placed outbound call to [place] [phone]  . Left a voice message for extension [phone], provided my name, contact number and the purpose of my call to determine if services were started for this patient following a referral sent by the PCP provider in August  Patient Self Care Activities:  . Currently UNABLE TO independently perform Self Care  Please see past updates related to this goal by clicking on the "Past Updates" button in the selected goal         The patient verbalized understanding of instructions provided today and declined a print copy of patient instruction materials.   Telephone follow up appointment with care management team member scheduled for: 03/01/19 Follow up with provider re: [facility] PT: 02/05/19     Barb Merino, RN, BSN, CCM Care Management Coordinator [place] [facility]  Direct Phone: [phone]

## 2019-02-01 NOTE — Chronic Care Management (AMB) (Signed)
Chronic Care Management   Follow Up Note   02/01/2019 Name: Brett Mora MRN: [id number] DOB: 02-02-1920  Referred by: Minette Brine, FNP Reason for referral : Chronic Care Management (CCM RNCM Telephone Follow up)   Norma Minette is a 85 y.o. year old male who is a primary care patient of Minette Brine, [place]. The CCM team was consulted for assistance with chronic disease management and care coordination needs.    Review of patient status, including review of consultants reports, relevant laboratory and other test results, and collaboration with appropriate care team members and the patient's provider was performed as part of comprehensive patient evaluation and provision of chronic care management services.    SDOH (Social Determinants of Health) screening performed today: None. See Care Plan for related entries.   Advanced Directives Status: N See Care Plan and Vyn[employer] application for related entries.  I spoke with Mr. morry bazil by telephone to follow up on patient's recent fall and ED visit.   Outpatient Encounter Medications as of 02/01/2019  Medication Sig  . acetaminophen (TYLENOL) 325 MG tablet Take 650 mg by mouth every 6 (six) hours as needed for mild pain or headache.  Marland Kitchen amLODipine (NORVASC) 5 MG tablet TAKE 1 TABLET BY MOUTH EVERY DAY (Patient taking differently: Take 5 mg by mouth daily. )  . Calcium Carbonate-Vitamin D (CALCIUM 600+D) 600-400 MG-UNIT per tablet Take 1 tablet by mouth 3 (three) times daily with meals.   . Cholecalciferol (VITAMIN D PO) Take 1 tablet by mouth daily.  . diclofenac sodium (VOLTAREN) 1 % GEL Apply 2 g topically 4 (four) times daily. (Patient taking differently: Apply 2 g topically as needed (pain). )  . ferrous sulfate 325 (65 FE) MG tablet Take 1 tablet (325 mg total) by mouth daily.  . hydrochlorothiazide (HYDRODIURIL) 25 MG tablet TAKE 1 TABLET BY MOUTH EVERY DAY  . loratadine (CLARITIN) 10 MG tablet TAKE 1 TABLET BY MOUTH EVERY DAY  .  lubiprostone (AMITIZA) 8 MCG capsule Take 1 capsule (8 mcg total) by mouth 2 (two) times daily with a meal. (Patient not taking: Reported on 01/31/2019)  . simvastatin (ZOCOR) 20 MG tablet TAKE 1 TABLET EVERY DAY (Patient taking differently: Take 20 mg by mouth daily at 6 PM. )   No facility-administered encounter medications on file as of 02/01/2019.     Goals Addressed      Grandson [name] Joy Stated   . "I need some help with caring for my granddad" (pt-stated)       Current Barriers:  Marland Kitchen Knowledge Deficits related to Caregiver Resources for Respite Care  . Lacks caregiver support.   Nurse Case Manager Clinical Goal(s):  Marland Kitchen Over the next 30 days, patient will work with the CCM team to address needs related to Caregiver Resources to assist with Respite care   CCM RN CM Interventions:  02/01/19 call completed with patient's grandson [name]  . Received an inbound call from patient's grandson and primary caregiver [name] stating he needs caregiver assistance for his granddad; discussed his family including his younger brother, his aunt and his mother are involved with Mr. Mast care but not readily available to relieve [name] when he needs respite care; discussed he and the family would like to know what their options are for this type of service . Reiterated contacting the [place] personal discussed at last call to inquire about patient's eligible VA benefits . Discussed the embedded BSW Daneen Schick will contact him  to explore options and provide the appropriate resources . Collaborated with Daneen Schick BSW regarding Mr. lasaro caravantes concerns and request for Caregiver resources . Discussed plans with patient for ongoing care management follow up and provided patient with direct contact information for care management team . Social Work referral for embedded [place]   Patient Self Care Activities:  . Currently UNABLE TO independently perform self  care  Initial goal documentation       Other   . High Risk for Falls       Current Barriers:  . High Risk for Falls . Self Care Deficit . Impaired Physical Mobility secondary to potential CVA/TIA  Nurse Case Manager Clinical Goal(s):  Marland Kitchen Over the next 30 days, patient will work with CCM RN to address needs related to home safety and or durable medical equipment needed to help reduce the risk for falls  Interventions:  02/01/19 call completed with granson [name]   . Assessed for possible cause for patient's fall occurring on 01/31/19; Assessed for home safety concerns and or durable medical equipment needs - patient is using a rolling walker and has a manual W/C, he has grab bars in the bathroom as well as a raised toliet seat . Assessed for progressive physical debility secondary to patient's Cancer diagnosis and or there comorbidities that may potentially increase his risk for falls and or injury - grandson states, "I just feel like his legs are really weak" . Discussed patient may benefit from having a HSE and re-evaluation for in home PT for strengthening and balance . Assessed for symptoms suggestive of Orthostatic Hypotension; discussed standing or changing positions too quickly can cause a sudden drop in the BP triggering loss of balance and or a fall; instructed grandson to remind his granddad to change positions slowly in order to allow time for his BP to adjust . Advised grandson [name] to continue to notify the CCM team and or PCP of    any/all falls promptly  . Discussed per patients imaging of his Left shoulder from ED visit on 01/31/19, patient has a possible rotator cuff tear and should follow up with Orthopedics if his pain continues- grandson was not aware but reiterated his granddad will most likely refuse to do so . Discussed plans with grandson for ongoing care management follow up and provided patient with direct contact information for care management team  02/01/19  Placed outbound call to [place] [phone]  . Left a voice message for extension [phone], provided my name, contact number and the purpose of my call to determine if services were started for this patient following a referral sent by the PCP provider in August  Patient Self Care Activities:  . Currently UNABLE TO independently perform Self Care  Please see past updates related to this goal by clicking on the "Past Updates" button in the selected goal         Telephone follow up appointment with care management team member scheduled for: 03/01/19 02/05/19: Follow up with [facility] to confirm SNV/PT services have been received and initiated    Barb Merino, RN, BSN, CCM Care Management Coordinator [place] [facility]  Direct Phone: [phone]

## 2019-02-04 ENCOUNTER — Ambulatory Visit: Payer: Medicare Other | Admitting: Pharmacist

## 2019-02-04 ENCOUNTER — Telehealth (INDEPENDENT_AMBULATORY_CARE_PROVIDER_SITE_OTHER): Payer: Medicare Other | Admitting: Nurse Practitioner

## 2019-02-04 ENCOUNTER — Other Ambulatory Visit: Payer: Self-pay

## 2019-02-04 ENCOUNTER — Ambulatory Visit: Payer: Medicare Other

## 2019-02-04 ENCOUNTER — Encounter: Payer: Self-pay | Admitting: Nurse Practitioner

## 2019-02-04 VITALS — Temp 98.1°F | Wt 240.0 lb

## 2019-02-04 DIAGNOSIS — W19XXXA Unspecified fall, initial encounter: Secondary | ICD-10-CM

## 2019-02-04 DIAGNOSIS — I10 Essential (primary) hypertension: Secondary | ICD-10-CM | POA: Diagnosis not present

## 2019-02-04 DIAGNOSIS — M25511 Pain in right shoulder: Secondary | ICD-10-CM | POA: Diagnosis not present

## 2019-02-04 DIAGNOSIS — R296 Repeated falls: Secondary | ICD-10-CM

## 2019-02-04 DIAGNOSIS — M25572 Pain in left ankle and joints of left foot: Secondary | ICD-10-CM | POA: Diagnosis not present

## 2019-02-04 DIAGNOSIS — Z515 Encounter for palliative care: Secondary | ICD-10-CM | POA: Diagnosis not present

## 2019-02-04 DIAGNOSIS — E782 Mixed hyperlipidemia: Secondary | ICD-10-CM

## 2019-02-04 MED ORDER — TRAMADOL HCL 50 MG PO TABS: 50.0000 mg | ORAL_TABLET | Freq: Four times a day (QID) | ORAL | 0 refills | Status: AC | PRN

## 2019-02-04 NOTE — Progress Notes (Addendum)
Virtual Visit via Telephone   This visit type was conducted due to national recommendations for restrictions regarding the COVID-19 Pandemic (e.g. social distancing) in an effort to limit this patient's exposure and mitigate transmission in our community.  Due to his co-morbid illnesses, this patient is at least at moderate risk for complications without adequate follow up.  This format is felt to be most appropriate for this patient at this time.  All issues noted in this document were discussed and addressed.  A limited physical exam was performed with this format.    This visit type was conducted due to national recommendations for restrictions regarding the COVID-19 Pandemic (e.g. social distancing) in an effort to limit this patient's exposure and mitigate transmission in our community.  Patients identity confirmed using two different identifiers.  This format is felt to be most appropriate for this patient at this time.  All issues noted in this document were discussed and addressed.  No physical exam was performed (except for noted visual exam findings with Video Visits).    Date:  02/10/2019   ID:  [name], DOB 1919/11/24, MRN [id number]  Patient Location:  Home - spoke with grandson [name] and daughter  Provider location:   Office    Chief Complaint:  ER visit after falling  History of Present Illness:    Kayaan Throop is a 85 y.o. male who presents via video conferencing for a telehealth visit today.    The patient does not have symptoms concerning for COVID-19 infection (fever, chills, cough, or new shortness of breath).   HPI  He had an episode where he fell an hurt his right shoulder and left ankle.  They were able to get him up. Sent him to ER.  He was wearing slippery socks and [name] feels that was the reason.  Now he has left swelling.  He was not able to walk on his foot day one, now not able to walk on it today.  He has been was given 2 tylenol and ice pack on the area.   The tylenol does not give him anything stronger.   Past Medical History:  Diagnosis Date  . Blood transfusion without reported diagnosis   . Hyperlipemia   . Hypertension   . Iron deficiency   . Stroke [facility])    Past Surgical History:  Procedure Laterality Date  . BACK SURGERY       Current Meds  Medication Sig  . acetaminophen (TYLENOL) 325 MG tablet Take 650 mg by mouth every 6 (six) hours as needed for mild pain or headache.  Marland Kitchen amLODipine (NORVASC) 5 MG tablet TAKE 1 TABLET BY MOUTH EVERY DAY (Patient taking differently: Take 5 mg by mouth daily. )  . Calcium Carbonate-Vitamin D (CALCIUM 600+D) 600-400 MG-UNIT per tablet Take 1 tablet by mouth 3 (three) times daily with meals.   . Cholecalciferol (VITAMIN D PO) Take 1 tablet by mouth daily.  . ferrous sulfate 325 (65 FE) MG tablet Take 1 tablet (325 mg total) by mouth daily.  . hydrochlorothiazide (HYDRODIURIL) 25 MG tablet TAKE 1 TABLET BY MOUTH EVERY DAY  . simvastatin (ZOCOR) 20 MG tablet TAKE 1 TABLET EVERY DAY (Patient taking differently: Take 20 mg by mouth daily at 6 PM. )     Allergies:   Patient has no known allergies.   Social History   Tobacco Use  . Smoking status: Former Research scientist (life sciences)  . Smokeless tobacco: Never Used  Substance Use Topics  . Alcohol use:  No  . Drug use: No     Family Hx: The patient's family history is not on file.  ROS:   Please see the history of present illness.    Review of Systems  Constitutional: Negative.   Respiratory: Negative.   Cardiovascular: Negative.   Genitourinary: Negative.   Musculoskeletal: Positive for falls (ER visit on 9/24).       Left ankle pain and swelling  Neurological: Negative.  Negative for dizziness.  Psychiatric/Behavioral: Negative.     All other systems reviewed and are negative.   Labs/Other Tests and Data Reviewed:    Recent Labs: 05/16/2018: ALT 9; BUN 14; Creatinine, Ser 1.12; Hemoglobin 12.9; Platelets 205; Potassium 3.8; Sodium 143   Recent  Lipid Panel Lab Results  Component Value Date/Time   CHOL 149 05/16/2018 04:39 PM   TRIG 143 05/16/2018 04:39 PM   HDL 40 05/16/2018 04:39 PM   CHOLHDL 3.7 05/16/2018 04:39 PM   CHOLHDL 2.5 07/12/2013 03:51 AM   LDLCALC 80 05/16/2018 04:39 PM    Wt Readings from Last 3 Encounters:  02/04/19 240 lb (108.9 kg)  12/27/18 240 lb (108.9 kg)  05/16/18 240 lb (108.9 kg)     Exam:    Vital Signs:  Temp 98.1 F (36.7 C) (Oral)   Wt 240 lb (108.9 kg)   BMI 35.44 kg/m     Physical Exam  Constitutional: He is oriented to person, place, and time. No distress.  Neurological: He is alert and oriented to person, place, and time.  Psychiatric: Mood, memory, affect and judgment normal.  Family also provides information    ASSESSMENT & PLAN:    1. Acute left ankle pain  Left ankle pain after falling 4 days ago  Will order an xray mobile to obtain  Tylenol has been ineffective alone, I have sent a Rx for Tramadol to alternate with Tylenol  2. Frequent falls  Had another episode where he fell and the family was unable to pick him up  He went to the ER for evaluation which was all negative test he did not have an xray of his foot since that began hurting after the visit  3. Palliative care patient  He is having more frequent falls and [facility] Case Manager Glenard Haring inquired if he may be ready for hospice.  He has palliative care in the home already  He has been having an increase in falls  The family is having more difficulty with getting him out of the house.  He does not want to seek care from the Urologist at this time.   He is requiring more assistance with ADL's  4. Acute pain of right shoulder  After suffering a fall,  Can continue with tylenol scheduled and I sent a limited supply of Tramadol   COVID-19 Education: The signs and symptoms of COVID-19 were discussed with the patient and how to seek care for testing (follow up with PCP or arrange E-visit).  The importance  of social distancing was discussed today.  Patient Risk:   After full review of this patients clinical status, I feel that they are at least moderate risk at this time.  Time:   Today, I have spent 20 minutes/ seconds with the patient with telehealth technology discussing above diagnoses.     Medication Adjustments/Labs and Tests Ordered: Current medicines are reviewed at length with the patient today.  Concerns regarding medicines are outlined above.   Tests Ordered: No orders of the defined types were placed  in this encounter.   Medication Changes: Meds ordered this encounter  Medications  . traMADol (ULTRAM) 50 MG tablet    Sig: Take 1 tablet (50 mg total) by mouth every 6 (six) hours as needed.    Dispense:  20 tablet    Refill:  0    Disposition:  Follow up prn  Signed, Minette Brine, FNP

## 2019-02-04 NOTE — Chronic Care Management (AMB) (Signed)
Chronic Care Management   Social Work Follow Up Note  02/04/2019 Name: Brett Mora MRN: Brett Mora DOB: Nov 25, 1919  Brett Mora is a 85 y.o. year old male who is a primary care patient of Brett Mora, Brett Mora. The CCM team was consulted for assistance with Caregiver Stress.   Review of patient status, including review of consultants reports, other relevant assessments, and collaboration with appropriate care team members and the patient's provider was performed as part of comprehensive patient evaluation and provision of chronic care management services.    I reached out to [name] on today's date in response to communication received from RN Case Manager regarding caregiver strain. See care plan below:   Outpatient Encounter Medications as of 02/04/2019  Medication Sig  . acetaminophen (TYLENOL) 325 MG tablet Take 650 mg by mouth every 6 (six) hours as needed for mild pain or headache.  Marland Kitchen amLODipine (NORVASC) 5 MG tablet TAKE 1 TABLET BY MOUTH EVERY DAY (Patient taking differently: Take 5 mg by mouth daily. )  . Calcium Carbonate-Vitamin D (CALCIUM 600+D) 600-400 MG-UNIT per tablet Take 1 tablet by mouth 3 (three) times daily with meals.   . Cholecalciferol (VITAMIN D PO) Take 1 tablet by mouth daily.  . diclofenac sodium (VOLTAREN) 1 % GEL Apply 2 g topically 4 (four) times daily. (Patient not taking: Reported on 02/04/2019)  . ferrous sulfate 325 (65 FE) MG tablet Take 1 tablet (325 mg total) by mouth daily.  . hydrochlorothiazide (HYDRODIURIL) 25 MG tablet TAKE 1 TABLET BY MOUTH EVERY DAY  . lubiprostone (AMITIZA) 8 MCG capsule Take 1 capsule (8 mcg total) by mouth 2 (two) times daily with a meal.  . simvastatin (ZOCOR) 20 MG tablet TAKE 1 TABLET EVERY DAY (Patient taking differently: Take 20 mg by mouth daily at 6 PM. )   No facility-administered encounter medications on file as of 02/04/2019.      Goals Addressed            This Visit's Progress     Patient Stated   . "I need some  help with caring for my granddad" (pt-stated)       Current Barriers:  Marland Kitchen Knowledge Deficits related to Caregiver Resources for Respite Care  . Lacks caregiver support.   Nurse Case Manager Clinical Goal(s):  Marland Kitchen Over the next 30 days, patient will work with the CCM team to address needs related to Caregiver Resources to assist with Respite care   CCM SW Interventions Completed 02/04/2019 with Brett Mora . Outbound call to [name] in response to RN Case Manager request to assist with caregiver resources . [name] interviewed and appropriate assessments performed . Determined [name] has been a caregiver to the patient for the past 7 years and is interested in respite services to assist with patient care needs . Informed by [name] he is a sole caregiver to the patient with little to no assistance from other family members "I need a break and I feel like they should help me when I ask for it" . Confirmed the patient is covered under both traditional Medicare and BCBS with no Medicaid coverage . Attempted to assess for patient ability to qualify for Medicaid. Unfortunately, [name] is unaware of the patients monthly income. . Educated [name] on caregiver options including Medicaid PCS, Privately hiring a caregiver, [facility] in-home aide program, and [employer] Caregiver Respite program . Informed [name] that several programs offered at low  or no cost have a lengthy wait list.  . [name] declines SW assistance with placing the patient on the wait list stating "I'm gonna forget about that and just wing it and see what I can do" . Encouraged [name] to be open and honest with family members requesting a scheduled day each week for [name] to have a break from caregiver duties . Scheduled follow up call over the next week to assess for interest in applying for Medicaid or placing the patient on a wait list for services  Patient Self Care Activities:  .  Currently UNABLE TO independently perform self care  Please see past updates related to this goal by clicking on the "Past Updates" button in the selected goal        Other   . "My grandfathers ankle is really swollen"       Brett Mora stated:  Current Barriers:  . Recent fall in the home . Limited knowledge on how to best care for grandfathers ankle injury  Clinical Social Work Clinical Goal(s):  Marland Kitchen Over the next 15 days the patient and his grandson will work with the primary care team to address recent ankle injury.  CCM SW Interventions: Completed 02/04/2019 with Brett Mora . [name] interviewed and appropriate assessment performed . Determined the patient had a recent ED visit due to a fall "since being home his ankle is very swollen and painful" . Assessed for current intervention to help alleviate pain. Mr Brett Mora reports utilizing ice, elevation, and tylenol over the last several days without relief for the patient . Determined the patient has an appointment with his primary provider today at 4:00 pm . Advised [name] SW would communicate current ankle concerns to Mrs. Brett Brine, Brett Mora via secure chat message . Encouraged [name] to speak with Brett Mora regarding the patients ankle swelling and pain during the scheduled appointment . Collaboration with RN Case Manager regarding ankle concerns  Patient Self Care Activities:  . Currently UNABLE TO independently bear weight due to ankle pain  Initial goal documentation         Follow Up Plan: SW will follow up with patient by phone over the next week.  Daneen Schick, BSW, CDP Social Worker, Certified Dementia Practitioner [place] [facility] [place] [facility] [phone]  Total time spent performing care coordination and/or care management activities with the patient by phone or face to face = 25 minutes.

## 2019-02-04 NOTE — Patient Instructions (Signed)
Social Worker Visit Information  Goals we discussed today:  Goals Addressed            This Visit's Progress     Patient Stated   . "I need some help with caring for my granddad" (pt-stated)       Current Barriers:  Marland Kitchen Knowledge Deficits related to Caregiver Resources for Respite Care  . Lacks caregiver support.   Nurse Case Manager Clinical Goal(s):  Marland Kitchen Over the next 30 days, patient will work with the CCM team to address needs related to Caregiver Resources to assist with Respite care   CCM SW Interventions Completed 02/04/2019 with [name] . Outbound call to the patients grandson [name] Joy in response to RN Case Manager request to assist with caregiver resources . [name] interviewed and appropriate assessments performed . Determined [name] has been a caregiver to the patient for the past 7 years and is interested in respite services to assist with patient care needs . Informed by [name] he is a sole caregiver to the patient with little to no assistance from other family members "I need a break and I feel like they should help me when I ask for it" . Confirmed the patient is covered under both traditional Medicare and BCBS with no Medicaid coverage . Attempted to assess for patient ability to qualify for Medicaid. Unfortunately, [name] is unaware of the patients monthly income. . Educated [name] on caregiver options including Medicaid PCS, Privately hiring a caregiver, [facility] in-home aide program, and [employer] operational officer program . Informed [name] that several programs offered at low or no cost have a lengthy wait list.  . [name] declines SW assistance with placing the patient on the wait list stating "I'm gonna forget about that and just wing it and see what I can do" . Encouraged [name] to be open and honest with family members requesting a scheduled day each week for [name] to have a break from caregiver duties . Scheduled follow up call  over the next week to assess for interest in applying for Medicaid or placing the patient on a wait list for services  Patient Self Care Activities:  . Currently UNABLE TO independently perform self care  Please see past updates related to this goal by clicking on the "Past Updates" button in the selected goal        Other   . "My grandfathers ankle is really swollen"       Talmage Nap stated:  Current Barriers:  . Recent fall in the home . Limited knowledge on how to best care for grandfathers ankle injury  Clinical Social Work Clinical Goal(s):  Marland Kitchen Over the next 15 days the patient and his grandson will work with the primary care team to address recent ankle injury.  CCM SW Interventions: Completed 02/04/2019 with [name] . [name] interviewed and appropriate assessment performed . Determined the patient had a recent ED visit due to a fall "since being home his ankle is very swollen and painful" . Assessed for current intervention to help alleviate pain. Mr [name] reports utilizing ice, elevation, and tylenol over the last several days without relief for the patient . Determined the patient has an appointment with his primary provider today at 4:00 pm . Advised [name] SW would communicate current ankle concerns to Mrs. Minette Brine, FNP via secure chat message . Encouraged [name] to speak with Mrs. Laurance Flatten  regarding the patients ankle swelling and pain during the scheduled appointment . Collaboration with RN Case Manager regarding ankle concerns  Patient Self Care Activities:  . Currently UNABLE TO independently bear weight due to ankle pain  Initial goal documentation         Materials Provided: Verbal education about caregiver resources provided by phone  Follow Up Plan: SW will follow up with patient by phone over the next week.   Daneen Schick, BSW, CDP Social Worker, Certified Dementia Practitioner [place] / [place] [facility] [phone]

## 2019-02-05 ENCOUNTER — Ambulatory Visit: Payer: Self-pay

## 2019-02-05 ENCOUNTER — Other Ambulatory Visit: Payer: Self-pay | Admitting: Pharmacy Technician

## 2019-02-05 ENCOUNTER — Other Ambulatory Visit: Payer: Medicare Other | Admitting: Adult Health Nurse Practitioner

## 2019-02-05 ENCOUNTER — Telehealth: Payer: Medicare Other

## 2019-02-05 ENCOUNTER — Other Ambulatory Visit: Payer: Self-pay | Admitting: Nurse Practitioner

## 2019-02-05 DIAGNOSIS — I69354 Hemiplegia and hemiparesis following cerebral infarction affecting left non-dominant side: Secondary | ICD-10-CM

## 2019-02-05 DIAGNOSIS — E782 Mixed hyperlipidemia: Secondary | ICD-10-CM | POA: Diagnosis not present

## 2019-02-05 DIAGNOSIS — I10 Essential (primary) hypertension: Secondary | ICD-10-CM

## 2019-02-05 DIAGNOSIS — Z515 Encounter for palliative care: Secondary | ICD-10-CM | POA: Diagnosis not present

## 2019-02-05 NOTE — Progress Notes (Signed)
[facility] Consult Note Telephone: [phone]  Fax: [phone]  PATIENT NAME: Brett Mora DOB: [birth year] MRN: [id number]  PRIMARY CARE PROVIDER:   Minette Brine, FNP  REFERRING PROVIDER:  Minette Mora, [address] [place],  [place] [address]  RESPONSIBLE PARTY:   Self and grandson, Brett Mora [phone] Brett Mora, daughter  [phone] Brett Mora, daughter H: [phone]  C: [phone]   Due to the COVID-19 crisis, this telephone evaluation and treatment contact was done via telephone and it was initiated and consent by this patient and or family    RECOMMENDATIONS and PLAN:  1.  Advanced care planning.  Patient is a DNR.  This was a telephone visit in which I was initially contacted by the nurse clinical manager at the patient's PCP office.  She stated that he had another fall in which he was evaluated at the ED. Was calling to inquire about hospice versus home health therapy. Brett Mora stated that his left ankle did not start swelling and hurting until after he got home.  States that the patient is unable to bear weight on it.  He had already seen his PCP via telehealth about the swelling and pain and was started on ice and tramadol.  Grandson felt that other than the fall that his grandfather was doing well and eating well with no weight change.  Felt like hospice was not warranted.  Patient does have upcoming visit with palliative NP.  Though patient is not having a cognitive decline, he is having a decline in mobility with recurrent falls.  Hospice would be a reasonable choice. After talking with nurse clinical manager further there are some concerns about the grandson taking care of his grandfather and needing more help.  She stated she was going to reach out to his daughters and talk about hospice versus therapy.   Received email stating that family wanted to pursue hospice services and that their office would  send over a referral for hospice.    I spent 30 minutes providing this consultation,  from 10:15 to 10:45. More than 50% of the time in this consultation was spent coordinating communication.   HISTORY OF PRESENT ILLNESS:  Brett Mora is a 85 y.o. year old male with multiple medical problems including HLD, HTN, iron deficiency anemia, left sided weakness post CVA. Palliative Care was asked to help address goals of care.   CODE STATUS: DNR  PPS: 0% HOSPICE ELIGIBILITY/DIAGNOSIS: TBD  PAST MEDICAL HISTORY:  Past Medical History:  Diagnosis Date  . Blood transfusion without reported diagnosis   . Hyperlipemia   . Hypertension   . Iron deficiency   . Stroke [facility])     SOCIAL HX:  Social History   Tobacco Use  . Smoking status: Former Research scientist (life sciences)  . Smokeless tobacco: Never Used  Substance Use Topics  . Alcohol use: No    ALLERGIES: No Known Allergies   PERTINENT MEDICATIONS:  Outpatient Encounter Medications as of 02/05/2019  Medication Sig  . acetaminophen (TYLENOL) 325 MG tablet Take 650 mg by mouth every 6 (six) hours as needed for mild pain or headache.  Marland Kitchen amLODipine (NORVASC) 5 MG tablet TAKE 1 TABLET BY MOUTH EVERY DAY (Patient taking differently: Take 5 mg by mouth daily. )  . Calcium Carbonate-Vitamin D (CALCIUM 600+D) 600-400 MG-UNIT per tablet Take 1 tablet by mouth 3 (three) times daily with meals.   . Cholecalciferol (VITAMIN D PO) Take 1 tablet by mouth daily.  Marland Kitchen  diclofenac sodium (VOLTAREN) 1 % GEL Apply 2 g topically 4 (four) times daily. (Patient not taking: Reported on 02/04/2019)  . ferrous sulfate 325 (65 FE) MG tablet Take 1 tablet (325 mg total) by mouth daily.  . hydrochlorothiazide (HYDRODIURIL) 25 MG tablet TAKE 1 TABLET BY MOUTH EVERY DAY  . lubiprostone (AMITIZA) 8 MCG capsule Take 1 capsule (8 mcg total) by mouth 2 (two) times daily with a meal.  . simvastatin (ZOCOR) 20 MG tablet TAKE 1 TABLET EVERY DAY (Patient taking differently: Take 20 mg by mouth daily at  6 PM. )  . traMADol (ULTRAM) 50 MG tablet Take 1 tablet (50 mg total) by mouth every 6 (six) hours as needed.   No facility-administered encounter medications on file as of 02/05/2019.     Richerd Grime Jenetta Downer, NP

## 2019-02-05 NOTE — Patient Outreach (Signed)
[facility]) Care Management  02/05/2019  Brett Mora 1920-03-15 [id number]                                       Medication Assistance Referral  Referral From: [facility] RPh Jenne Pane (Clinic RPh)  Medication/Company: [employer] Patient application portion:  Mailed Provider application portion: Faxed  to J. Laurance Flatten, [place] Provider address/fax verified via: Office website   Follow up:  Will follow up with patient in 7-10 business days to confirm application(s) have been received.  Maud Deed Chana Bode Bollinger Certified Pharmacy Technician [place] Management Direct Dial:[phone]

## 2019-02-06 ENCOUNTER — Other Ambulatory Visit: Payer: Self-pay | Admitting: Nurse Practitioner

## 2019-02-06 ENCOUNTER — Ambulatory Visit: Payer: Self-pay

## 2019-02-06 ENCOUNTER — Telehealth: Payer: Medicare Other

## 2019-02-06 ENCOUNTER — Other Ambulatory Visit: Payer: Self-pay

## 2019-02-06 DIAGNOSIS — I69354 Hemiplegia and hemiparesis following cerebral infarction affecting left non-dominant side: Secondary | ICD-10-CM

## 2019-02-06 DIAGNOSIS — R269 Unspecified abnormalities of gait and mobility: Secondary | ICD-10-CM

## 2019-02-06 DIAGNOSIS — E782 Mixed hyperlipidemia: Secondary | ICD-10-CM

## 2019-02-06 DIAGNOSIS — I10 Essential (primary) hypertension: Secondary | ICD-10-CM | POA: Diagnosis not present

## 2019-02-06 DIAGNOSIS — Z515 Encounter for palliative care: Secondary | ICD-10-CM

## 2019-02-06 NOTE — Patient Instructions (Signed)
Visit Information  Goals Addressed    . High Risk for Falls       Current Barriers:  . High Risk for Falls . Self Care Deficit . Impaired Physical Mobility secondary to potential CVA/TIA  Nurse Case Manager Clinical Goal(s):  Marland Kitchen Over the next 30 days, patient will work with CCM RN to address needs related to home safety and or durable medical equipment needed to help reduce the risk for falls  CCM RN CM Interventions:  02/05/19 Placed outbound call to [place]  . Left a voice message for Hanley Ben NP, introduced myself and stated the purpose of my call, requesting a call back to discuss patient's eligibility for Hospice . Inbound call received from Hanley Ben, NP from [facility]; discussed Mr. Partin has experienced 2 falls in his home in the past 3-4 weeks with 1 fall resulting in an ED visit and grandson reports hie right ankle is now swollen and has impaired his physical mobility even more so . Discussed it has been undetermined if [facility] PT ever came out to the home to evaluate for PT when ordered in late August . Discussed Amy will reach out to the grandson and assess for a decline in physical health; she will collaborate with her Hospice MD and will give me a call back . Sent in basket message to provider Minette Brine, FNP and embedded [place] with an update  02/05/19 Inbound follow up call received from Hanley Ben NP from [place]  . Discussed the patient's grandson stated he did not feel his grandfather has declined despite the recent falls . Discussed the grandson would like to receive HHS and a HSE . Discussed having the CCM team f/u with daughter [name] to discuss patient's status and consideration of Hospice . Discussed Mr. Lotz has transitioned to a new Palliative Care NP, Dennison Bulla [phone] and that further collaboration should be provided to this NP  02/05/19 spoke with patient's daughter [name]  [phone]  . Discussed patient's recent fall and grandson [name]'s reports of patient c/o right ankle pain and swelling . Discussed this injury was related to the recent fall but symptoms did not show up until after his visit to the ED . Discussed grandson is elevating and applying ice packs with minimal relief, he is also giving Tylenol as directed . Discussed FNP Minette Brine suggested an xray may be needed but was not ordered following patient's telephone visit on 02/04/19 . Discussed collaboration with [facility] NP Amy Olena Heckle today; discussed next home NP visit is scheduled for 02/07/19 and will be completed with newly assigned NP Awilda Bill; provided dtr [name] with the contact number for [facility] . Discussed best option for patient's ongoing care and consideration for Hospice verses Palliative Care; discussed if family wishes to remain in [place], a new order for HHS will be sent to a new [facility] provider due to lack of collaboration and or follow up with [facility] after multiple attempts have been made . Discussed grandson and caregiver Shelda Jakes concerns related to not having the support in the home and or being able to provide the care needed for Mr. Xie . Discussed dtr [name] and her sister agree that he is not getting the care that is needed despite [name]'s best efforts being given . Discussed they had discussed and considered Hospice at the time of [facility]'s admission into Palliative and strongly feel this will be best for Mr. Blakeman . Discussed dtr [name]  will reach out to her sister and family to discuss having patient transition to Hospice; discussed [name] will give me a call back to confirm later today . Sent in basket message to provider Minette Brine, FNP and embedded BSW Daneen Schick; Sent in basket message to [facility] providers Amy Olena Heckle NP and Awilda Bill NP informing them the family will make a decision whether or not to transition to Hospice and give me a  call back later today  . Discussed plans with patient for ongoing care management follow up and provided patient with direct contact information for care management team   02/06/19 spoke with patient's daughter [name]   . Received voice message from daughter [name] stating the family has decided to transition Mr. Reuther to [place] . Sent in basket message to PCP provider Minette Brine, FNP informing her the family would like to transition Mr. Platt to [place]; requested orders be forwarded to [facility]; advised this RNCM will notify the Palliative NP's Amy Olena Heckle and Awilda Bill of the families decision . Placed outbound call to dtr [name]; advised I have notified provider Minette Brine FNP of their decision and have requested an order be sent to [facility] for Hospice Care . Placed outbound call to Awilda Bill NP [phone] with [place] Chimera; discussed Mr. Schoenbeck daughter [name] has informed me the family would like to transition Mr. Schnitker to [place]; advised I have alerted PCP provider Minette Brine, FNP of this decision and requested orders be sent to [facility] to initiate Hospice Care . Received reply from Minette Brine, [place] advising she will place the order for Hospice   Patient Self Care Activities:  . Currently UNABLE TO independently perform Self Care  Please see past updates related to this goal by clicking on the "Past Updates" button in the selected goal         The patient verbalized understanding of instructions provided today and declined a print copy of patient instruction materials.   Follow up with provider re: initiation of [place], RN, BSN, CCM Care Management Coordinator [place] [facility]  Direct Phone: [phone]

## 2019-02-06 NOTE — Chronic Care Management (AMB) (Signed)
Chronic Care Management   Follow Up Note   02/06/2019 Name: Mabel Arns MRN: [id number] DOB: June 20, 1919  Referred by: Minette Brine, FNP Reason for referral : Chronic Care Management (CCM RNCM Telephone Follow up)   Rushi Zingg is a 85 y.o. year old male who is a primary care patient of Minette Brine, [place]. The CCM team was consulted for assistance with chronic disease management and care coordination needs.    Review of patient status, including review of consultants reports, relevant laboratory and other test results, and collaboration with appropriate care team members and the patient's provider was performed as part of comprehensive patient evaluation and provision of chronic care management services.    SDOH (Social Determinants of Health) screening performed today: None. See Care Plan for related entries.   Advanced Directives Status: N See Care Plan and [place] application for related entries.  I spoke with Mr. Strodtman daughter [name] by telephone today discuss the family would like to transition Mr. Fennewald to [place].   Outpatient Encounter Medications as of 02/06/2019  Medication Sig  . acetaminophen (TYLENOL) 325 MG tablet Take 650 mg by mouth every 6 (six) hours as needed for mild pain or headache.  Marland Kitchen amLODipine (NORVASC) 5 MG tablet TAKE 1 TABLET BY MOUTH EVERY DAY (Patient taking differently: Take 5 mg by mouth daily. )  . Calcium Carbonate-Vitamin D (CALCIUM 600+D) 600-400 MG-UNIT per tablet Take 1 tablet by mouth 3 (three) times daily with meals.   . Cholecalciferol (VITAMIN D PO) Take 1 tablet by mouth daily.  . diclofenac sodium (VOLTAREN) 1 % GEL Apply 2 g topically 4 (four) times daily. (Patient not taking: Reported on 02/04/2019)  . ferrous sulfate 325 (65 FE) MG tablet Take 1 tablet (325 mg total) by mouth daily.  . hydrochlorothiazide (HYDRODIURIL) 25 MG tablet TAKE 1 TABLET BY MOUTH EVERY DAY  . lubiprostone (AMITIZA) 8 MCG capsule Take 1 capsule (8 mcg total) by  mouth 2 (two) times daily with a meal.  . simvastatin (ZOCOR) 20 MG tablet TAKE 1 TABLET EVERY DAY (Patient taking differently: Take 20 mg by mouth daily at 6 PM. )  . traMADol (ULTRAM) 50 MG tablet Take 1 tablet (50 mg total) by mouth every 6 (six) hours as needed.   No facility-administered encounter medications on file as of 02/06/2019.      Goals Addressed    . High Risk for Falls       Current Barriers:  . High Risk for Falls . Self Care Deficit . Impaired Physical Mobility secondary to potential CVA/TIA  Nurse Case Manager Clinical Goal(s):  Marland Kitchen Over the next 30 days, patient will work with CCM RN to address needs related to home safety and or durable medical equipment needed to help reduce the risk for falls  CCM RN CM Interventions:  02/05/19 Placed outbound call to [place]  . Left a voice message for Hanley Ben NP, introduced myself and stated the purpose of my call, requesting a call back to discuss patient's eligibility for Hospice . Inbound call received from Hanley Ben, NP from [facility]; discussed Mr. Gammell has experienced 2 falls in his home in the past 3-4 weeks with 1 fall resulting in an ED visit and grandson reports hie right ankle is now swollen and has impaired his physical mobility even more so . Discussed it has been undetermined if [facility] PT ever came out to the home to evaluate for PT when ordered in late August .  Discussed Amy will reach out to the grandson and assess for a decline in physical health; she will collaborate with her Hospice MD and will give me a call back . Sent in basket message to provider Minette Brine, FNP and embedded [place] with an update  02/05/19 Inbound follow up call received from Hanley Ben NP from [place]  . Discussed the patient's grandson stated he did not feel his grandfather has declined despite the recent falls . Discussed the grandson would like to receive HHS and a HSE . Discussed  having the CCM team f/u with daughter [name] to discuss patient's status and consideration of Hospice . Discussed Mr. Arel has transitioned to a new Palliative Care NP, Dennison Bulla [phone] and that further collaboration should be provided to this NP  02/05/19 spoke with patient's daughter [name] [phone]  . Discussed patient's recent fall and grandson [name]'s reports of patient c/o right ankle pain and swelling . Discussed this injury was related to the recent fall but symptoms did not show up until after his visit to the ED . Discussed grandson is elevating and applying ice packs with minimal relief, he is also giving Tylenol as directed . Discussed FNP Minette Brine suggested an xray may be needed but was not ordered following patient's telephone visit on 02/04/19 . Discussed collaboration with [facility] NP Amy Olena Heckle today; discussed next home NP visit is scheduled for 02/07/19 and will be completed with newly assigned NP Awilda Bill; provided dtr [name] with the contact number for [facility] . Discussed best option for patient's ongoing care and consideration for Hospice verses Palliative Care; discussed if family wishes to remain in [place], a new order for HHS will be sent to a new [facility] provider due to lack of collaboration and or follow up with [facility] after multiple attempts have been made . Discussed grandson and caregiver Shelda Jakes concerns related to not having the support in the home and or being able to provide the care needed for Mr. Abney . Discussed dtr [name] and her sister agree that he is not getting the care that is needed despite [name]'s best efforts being given . Discussed they had discussed and considered Hospice at the time of [facility]'s admission into Palliative and strongly feel this will be best for Mr. Diggins . Discussed dtr [name] will reach out to her sister and family to discuss having patient transition to Hospice; discussed [name] will give me  a call back to confirm later today . Sent in basket message to provider Minette Brine, FNP and embedded BSW Daneen Schick; Sent in basket message to [facility] providers Amy Olena Heckle NP and Awilda Bill NP informing them the family will make a decision whether or not to transition to Hospice and give me a call back later today  . Discussed plans with patient for ongoing care management follow up and provided patient with direct contact information for care management team   02/06/19 spoke with patient's daughter [name]   . Received voice message from daughter [name] stating the family has decided to transition Mr. Amedee to [place] . Sent in basket message to PCP provider Minette Brine, FNP informing her the family would like to transition Mr. Feland to [place]; requested orders be forwarded to [facility]; advised this RNCM will notify the Palliative NP's Amy Olena Heckle and Awilda Bill of the families decision . Placed outbound call to dtr [name]; advised I have notified provider Minette Brine FNP of their  decision and have requested an order be sent to [facility] for Hospice Care . Placed outbound call to Awilda Bill NP [phone] with [place] Chimera; discussed Mr. Zepp daughter [name] has informed me the family would like to transition Mr. Hoerig to [place]; advised I have alerted PCP provider Minette Brine, FNP of this decision and requested orders be sent to [facility] to initiate Hospice Care . Received reply from Minette Brine, [place] advising she will place the order for Hospice   Patient Self Care Activities:  . Currently UNABLE TO independently perform Self Care  Please see past updates related to this goal by clicking on the "Past Updates" button in the selected goal          Follow up with provider re: initiation of [place], RN, BSN, CCM Care Management Coordinator [place] [facility]  Direct Phone:  [phone]

## 2019-02-06 NOTE — Patient Instructions (Signed)
Visit Information  Goals Addressed    . High Risk for Falls       Current Barriers:  . High Risk for Falls . Self Care Deficit . Impaired Physical Mobility secondary to potential CVA/TIA  Nurse Case Manager Clinical Goal(s):  Marland Kitchen Over the next 30 days, patient will work with CCM RN to address needs related to home safety and or durable medical equipment needed to help reduce the risk for falls  CCM RN CM Interventions:  02/05/19 Placed outbound call to [place]  . Left a voice message for Hanley Ben NP, introduced myself and stated the purpose of my call, requesting a call back to discuss patient's eligibility for Hospice . Inbound call received from Hanley Ben, NP from [facility]; discussed Mr. Captain has experienced 2 falls in his home in the past 3-4 weeks with 1 fall resulting in an ED visit and grandson reports hie right ankle is now swollen and has impaired his physical mobility even more so . Discussed it has been undetermined if [facility] PT ever came out to the home to evaluate for PT when ordered in late August . Discussed Amy will reach out to the grandson and assess for a decline in physical health; she will collaborate with her Hospice MD and will give me a call back . Sent in basket message to provider Minette Brine, FNP and embedded [place] with an update  02/05/19 Inbound follow up call received from Hanley Ben NP from [place]  . Discussed the patient's grandson stated he did not feel his grandfather has declined despite the recent falls . Discussed the grandson would like to receive HHS and a HSE . Discussed having the CCM team f/u with daughter [name] to discuss patient's status and consideration of Hospice . Discussed Mr. Mayne has transitioned to a new Palliative Care NP, Dennison Bulla [phone] and that further collaboration should be provided to this NP  02/05/19 spoke with patient's daughter [name]  [phone]  . Discussed patient's recent fall and grandson [name]'s reports of patient c/o right ankle pain and swelling . Discussed this injury was related to the recent fall but symptoms did not show up until after his visit to the ED . Discussed grandson is elevating and applying ice packs with minimal relief, he is also giving Tylenol as directed . Discussed FNP Minette Brine suggested an xray may be needed but was not ordered following patient's telephone visit on 02/04/19 . Discussed collaboration with [facility] NP Amy Olena Heckle today; discussed next home NP visit is scheduled for 02/07/19 and will be completed with newly assigned NP Awilda Bill; provided dtr [name] with the contact number for [facility] . Discussed best option for patient's ongoing care and consideration for Hospice verses Palliative Care; discussed if family wishes to remain in [place], a new order for HHS will be sent to a new [facility] provider due to lack of collaboration and or follow up with [facility] after multiple attempts have been made . Discussed grandson and caregiver Shelda Jakes concerns related to not having the support in the home and or being able to provide the care needed for Mr. Bufalini . Discussed dtr [name] and her sister agree that he is not getting the care that is needed despite [name]'s best efforts being given . Discussed they had discussed and considered Hospice at the time of [facility]'s admission into Palliative and strongly feel this will be best for Mr. Schlechter . Discussed dtr [name]  will reach out to her sister and family to discuss having patient transition to Hospice; discussed [name] will give me a call back to confirm later today . Sent in basket message to provider Minette Brine, FNP and embedded BSW Daneen Schick; Sent in basket message to [facility] providers Amy Olena Heckle NP and Awilda Bill NP informing them the family will make a decision whether or not to transition to Hospice and give me a  call back later today  . Discussed plans with patient for ongoing care management follow up and provided patient with direct contact information for care management team   02/06/19 spoke with patient's daughter [name]   . Received voice message from daughter [name] stating the family has decided to transition Mr. Beachum to [place] . Sent in basket message to PCP provider Minette Brine, FNP informing her the family would like to transition Mr. Berger to [place]; requested orders be forwarded to [facility]; advised this RNCM will notify the Palliative NP's Amy Olena Heckle and Awilda Bill of the families decision . Placed outbound call to dtr [name]; advised I have notified provider Minette Brine FNP of their decision and have requested an order be sent to [facility] for Hospice Care . Placed outbound call to Awilda Bill NP [phone] with [facility]onia Chimera; discussed Mr. Barsh daughter [name] has informed me the family would like to transition Mr. Lantzy to [place]; advised I have alerted PCP provider Minette Brine, FNP of this decision and requested orders be sent to [facility] to initiate Hospice Care . Received reply from Minette Brine, [place] advising she will place the order for Hospice   Patient Self Care Activities:  . Currently UNABLE TO independently perform Self Care  Please see past updates related to this goal by clicking on the "Past Updates" button in the selected goal         The patient verbalized understanding of instructions provided today and declined a print copy of patient instruction materials.   Follow up with provider re: initation of [place], RN, BSN, CCM Care Management Coordinator [place] [facility]  Direct Phone: [phone]

## 2019-02-06 NOTE — Chronic Care Management (AMB) (Signed)
Chronic Care Management   Follow Up Note   02/05/2019 Name: Brett Mora MRN: [id number] DOB: [birth year]  Referred by: Brett Brine, FNP Reason for referral : No chief complaint on file.   Brett Mora is a 85 y.o. year old male who is a primary care patient of Brett Mora, [place]. The CCM team was consulted for assistance with chronic disease management and care coordination needs.    Review of patient status, including review of consultants reports, relevant laboratory and other test results, and collaboration with appropriate care team members and the patient's provider was performed as part of comprehensive patient evaluation and provision of chronic care management services.    SDOH (Social Determinants of Health) screening performed today: None. See Care Plan for related entries.   Advanced Directives Status: N See Care Plan and Vynca application for related entries.  I spoke with patient's daughter Brett Mora by telephone today to discuss the family's long term care plan including the consideration to transitioning Brett Mora to [place].   Outpatient Encounter Medications as of 02/05/2019  Medication Sig  . acetaminophen (TYLENOL) 325 MG tablet Take 650 mg by mouth every 6 (six) hours as needed for mild pain or headache.  Marland Kitchen amLODipine (NORVASC) 5 MG tablet TAKE 1 TABLET BY MOUTH EVERY DAY (Patient taking differently: Take 5 mg by mouth daily. )  . Calcium Carbonate-Vitamin D (CALCIUM 600+D) 600-400 MG-UNIT per tablet Take 1 tablet by mouth 3 (three) times daily with meals.   . Cholecalciferol (VITAMIN D PO) Take 1 tablet by mouth daily.  . diclofenac sodium (VOLTAREN) 1 % GEL Apply 2 g topically 4 (four) times daily. (Patient not taking: Reported on 02/04/2019)  . ferrous sulfate 325 (65 FE) MG tablet Take 1 tablet (325 mg total) by mouth daily.  . hydrochlorothiazide (HYDRODIURIL) 25 MG tablet TAKE 1 TABLET BY MOUTH EVERY DAY  . lubiprostone (AMITIZA) 8 MCG capsule Take 1 capsule  (8 mcg total) by mouth 2 (two) times daily with a meal.  . simvastatin (ZOCOR) 20 MG tablet TAKE 1 TABLET EVERY DAY (Patient taking differently: Take 20 mg by mouth daily at 6 PM. )  . traMADol (ULTRAM) 50 MG tablet Take 1 tablet (50 mg total) by mouth every 6 (six) hours as needed.   No facility-administered encounter medications on file as of 02/05/2019.      Goals Addressed    . High Risk for Falls       Current Barriers:  . High Risk for Falls . Self Care Deficit . Impaired Physical Mobility secondary to potential CVA/TIA  Nurse Case Manager Clinical Goal(s):  Marland Kitchen Over the next 30 days, patient will work with CCM RN to address needs related to home safety and or durable medical equipment needed to help reduce the risk for falls  CCM RN CM Interventions:  02/05/19 Placed outbound call to [place]  . Left a voice message for Brett Ben NP, introduced myself and stated the purpose of my call, requesting a call back to discuss patient's eligibility for Hospice . Inbound call received from Brett Ben, NP from [facility]; discussed Brett Mora has experienced 2 falls in his home in the past 3-4 weeks with 1 fall resulting in an ED visit and grandson reports hie right ankle is now swollen and has impaired his physical mobility even more so . Discussed it has been undetermined if [facility] PT ever came out to the home to evaluate for PT when ordered in late  August . Discussed Brett will reach out to the grandson and assess for a decline in physical health; she will collaborate with her Hospice MD and will give me a call back . Sent in basket message to provider Brett Brine, FNP and embedded [place] with an update  02/05/19 Inbound follow up call received from Brett Ben NP from [place]  . Discussed the patient's grandson stated he did not feel his grandfather has declined despite the recent falls . Discussed the grandson would like to receive HHS and a HSE  . Discussed having the CCM team f/u with daughter Brett Mora to discuss patient's status and consideration of Hospice . Discussed Brett Mora has transitioned to a new Palliative Care NP, Brett Mora [phone] and that further collaboration should be provided to this NP  02/05/19 spoke with patient's daughter Brett Mora [phone]  . Discussed patient's recent fall and grandson Brett Mora's reports of patient c/o right ankle pain and swelling . Discussed this injury was related to the recent fall but symptoms did not show up until after his visit to the ED . Discussed grandson is elevating and applying ice packs with minimal relief, he is also giving Tylenol as directed . Discussed FNP Brett Mora suggested an xray may be needed but was not ordered following patient's telephone visit on 02/04/19 . Discussed collaboration with [facility] NP Brett Mora today; discussed next home NP visit is scheduled for 02/07/19 and will be completed with newly assigned NP Brett Mora; provided dtr Brett Mora with the contact number for [facility] . Discussed best option for patient's ongoing care and consideration for Hospice verses Palliative Care; discussed if family wishes to remain in [place], a new order for HHS will be sent to a new [facility] provider due to lack of collaboration and or follow up with [facility] after multiple attempts have been made . Discussed grandson and caregiver Brett Mora concerns related to not having the support in the home and or being able to provide the care needed for Brett Mora . Discussed dtr Brett Mora and her sister agree that he is not getting the care that is needed despite Brett Mora's best efforts being given . Discussed they had discussed and considered Hospice at the time of [facility]'s admission into Palliative and strongly feel this will be best for Brett Mora . Discussed dtr Brett Mora will reach out to her sister and family to discuss having patient transition to Hospice; discussed Brett Mora  will give me a call back to confirm later today . Sent in basket message to provider Brett Brine, FNP and embedded BSW Brett Mora; Sent in basket message to [facility] providers Brett Olena Heckle NP and Brett Bill NP informing them the family will make a decision whether or not to transition to Hospice and give me a call back later today  . Discussed plans with patient for ongoing care management follow up and provided patient with direct contact information for care management team   Patient Self Care Activities:  . Currently UNABLE TO independently perform Self Care  Please see past updates related to this goal by clicking on the "Past Updates" button in the selected goal          Telephone follow up appointment with care management team member scheduled for: 02/06/19   Barb Merino, RN, BSN, CCM Care Management Coordinator [place] [facility]  Direct Phone: [phone]

## 2019-02-07 ENCOUNTER — Other Ambulatory Visit: Payer: Medicare Other | Admitting: Hospice

## 2019-02-07 ENCOUNTER — Telehealth: Payer: Medicare Other

## 2019-02-07 ENCOUNTER — Ambulatory Visit: Payer: Self-pay

## 2019-02-07 DIAGNOSIS — I69354 Hemiplegia and hemiparesis following cerebral infarction affecting left non-dominant side: Secondary | ICD-10-CM

## 2019-02-07 DIAGNOSIS — Z515 Encounter for palliative care: Secondary | ICD-10-CM | POA: Diagnosis not present

## 2019-02-07 DIAGNOSIS — I10 Essential (primary) hypertension: Secondary | ICD-10-CM

## 2019-02-07 DIAGNOSIS — E782 Mixed hyperlipidemia: Secondary | ICD-10-CM

## 2019-02-07 NOTE — Patient Instructions (Signed)
Visit Information  Goals Addressed            This Visit's Progress   . COMPLETED: My grandfather needs help affording my medications       Talmage Nap stated:  Current Barriers:  . Financial Barriers: patient has [employer] and reports copay for Amitiza is cost prohibitive at this time  Pharmacist Clinical Goal(s):  Marland Kitchen Over the next 30 days, patient will work with PharmD and providers to relieve medication access concerns  CCM RN CM Interventions: 02/07/19 Placed outbound call to [place]   . Spoke with Awilda Bill NP [phone]; discussed she has just completed a home visit with Mr. Vandiest and his family; discussed the family are all agreeable for patient transitioning to [place] and have signed a MOST form and a DNR . Informed Nancy Nordmann that provider Minette Brine, FNP sent over Hospice orders to [facility] yesterday (02/06/19)  . Care plan closed due to patient being admitted into Hospice Care  Patient Self Care Activities:  . Patient will provide necessary portions of application   Please see past updates related to this goal by clicking on the "Past Updates" button in the selected goal      . We need help affording my grandfather's medication.       Current Barriers:  . Financial Barriers: patient has [employer] and reports copay for Amitiza is cost prohibitive at this time  Pharmacist Clinical Goal(s):  Marland Kitchen Over the next 30 days, patient will work with PharmD and providers to relieve medication access concerns  Interventions: . Comprehensive medication review completed; medication list updated in electronic medical record.  Baker Pierini by Bernita Buffy: Patient meets income/out of pocket spend criteria for this medication's patient assistance program. Reviewed application process. Patient will provide proof of income, out of pocket spend report, and will sign application. Will collaborate with primary care provider, Minette Brine, DNP,  FNP for their portion of application. Once completed, will submit to [facility] patient assistance program.  Patient Self Care Activities:  . Patient will provide necessary portions of application   Initial goal documentation        The patient verbalized understanding of instructions provided today and declined a print copy of patient instruction materials.   The care management team will reach out to the patient again over the next 3-4 weeks.  Regina Eck, PharmD, BCPS Clinical Pharmacist, [facility] [place]: [phone]

## 2019-02-07 NOTE — Chronic Care Management (AMB) (Signed)
Chronic Care Management   Follow Up Note   02/07/2019 Name: Brett Mora MRN: [id number] DOB: [birth year]  Referred by: Minette Brine, FNP Reason for referral : Chronic Care Management (CCM RNCM Telephone Outreach )   Brett Mora is a 85 y.o. year old male who is a primary care patient of Minette Brine, [place]. The CCM team was consulted for assistance with chronic disease management and care coordination needs.    Review of patient status, including review of consultants reports, relevant laboratory and other test results, and collaboration with appropriate care team members and the patient's provider was performed as part of comprehensive patient evaluation and provision of chronic care management services.    I spoke with Awilda Bill NP [phone] from [facility] for an update on her home visit with Brett Mora. Discussed that the orders for Hospice were sent by provider Minette Brine, FNP yesterday (02/06/19). Nancy Nordmann advised the family signed a DNR as well as a MOST form.   Outpatient Encounter Medications as of 02/07/2019  Medication Sig  . acetaminophen (TYLENOL) 325 MG tablet Take 650 mg by mouth every 6 (six) hours as needed for mild pain or headache.  Marland Kitchen amLODipine (NORVASC) 5 MG tablet TAKE 1 TABLET BY MOUTH EVERY DAY (Patient taking differently: Take 5 mg by mouth daily. )  . Calcium Carbonate-Vitamin D (CALCIUM 600+D) 600-400 MG-UNIT per tablet Take 1 tablet by mouth 3 (three) times daily with meals.   . Cholecalciferol (VITAMIN D PO) Take 1 tablet by mouth daily.  . diclofenac sodium (VOLTAREN) 1 % GEL Apply 2 g topically 4 (four) times daily. (Patient not taking: Reported on 02/04/2019)  . ferrous sulfate 325 (65 FE) MG tablet Take 1 tablet (325 mg total) by mouth daily.  . hydrochlorothiazide (HYDRODIURIL) 25 MG tablet TAKE 1 TABLET BY MOUTH EVERY DAY  . lubiprostone (AMITIZA) 8 MCG capsule Take 1 capsule (8 mcg total) by mouth 2 (two) times daily with a meal.  . simvastatin (ZOCOR)  20 MG tablet TAKE 1 TABLET EVERY DAY (Patient taking differently: Take 20 mg by mouth daily at 6 PM. )  . traMADol (ULTRAM) 50 MG tablet Take 1 tablet (50 mg total) by mouth every 6 (six) hours as needed.   No facility-administered encounter medications on file as of 02/07/2019.      Goals Addressed      Patient Stated   . COMPLETED: "I need some help with caring for my granddad" (pt-stated)       Current Barriers:  Marland Kitchen Knowledge Deficits related to Caregiver Resources for Respite Care  . Lacks caregiver support.   Nurse Case Manager Clinical Goal(s):  Marland Kitchen Over the next 30 days, patient will work with the CCM team to address needs related to Caregiver Resources to assist with Respite care   CCM RN CM Interventions 02/07/19 Placed outbound call to [place]   . Spoke with Awilda Bill NP [phone]; discussed she has just completed a home visit with Brett Mora and his family; discussed the family are all agreeable for patient transitioning to [place] and have signed a MOST form and a DNR . Informed Nancy Nordmann that provider Minette Brine, FNP sent over Hospice orders to [facility] yesterday (02/06/19)  . Care plan closed due to patient being admitted into Hospice Care  Patient Self Care Activities:  . Currently UNABLE TO independently perform self care  Please see past updates related to this goal by clicking on the "Past Updates" button in the selected goal  Other   . COMPLETED: "My grandfathers ankle is really swollen"       Talmage Nap stated:  Current Barriers:  . Recent fall in the home . Limited knowledge on how to best care for grandfathers ankle injury  Clinical Social Work Clinical Goal(s):  Marland Kitchen Over the next 15 days the patient and his grandson will work with the primary care team to address recent ankle injury.  CCM RN CM Interventions: 02/07/19 Placed outbound call to [facility]   . Spoke with Awilda Bill NP  [phone]; discussed she has just completed a home visit with Brett Mora and his family; discussed the family are all agreeable for patient transitioning to [place] and have signed a MOST form and a DNR . Informed Nancy Nordmann that provider Minette Brine, FNP sent over Hospice orders to [facility] yesterday (02/06/19)  . Care plan closed due to patient being admitted into Hospice Care  Patient Self Care Activities:  . Currently UNABLE TO independently bear weight due to ankle pain  Please see past updates related to this goal by clicking on the "Past Updates" button in the selected goal      . COMPLETED: High Risk for Falls       Current Barriers:  . High Risk for Falls . Self Care Deficit . Impaired Physical Mobility secondary to potential CVA/TIA  Nurse Case Manager Clinical Goal(s):  Marland Kitchen Over the next 30 days, patient will work with CCM RN to address needs related to home safety and or durable medical equipment needed to help reduce the risk for falls  CCM RN CM Interventions:  02/07/19 Placed outbound call to [place]   . Spoke with Awilda Bill NP [phone]; discussed she has just completed a home visit with Brett Mora and his family; discussed the family are all agreeable for patient transitioning to [place] and have signed a MOST form and a DNR . Informed Nancy Nordmann that provider Minette Brine, FNP sent over Hospice orders to [facility] yesterday (02/06/19)  . Care plan closed due to patient being admitted into Hospice Care  Patient Self Care Activities:  . Currently UNABLE TO independently perform Self Care  Please see past updates related to this goal by clicking on the "Past Updates" button in the selected goal      . COMPLETED: My grandfather may have had a stroke       Current Barriers:  Marland Kitchen Knowledge Deficits related to disease process, diagnosis and treatment management of Cerebral Infarction  . High Risk for injury secondary to potential Cerebral  Vascular Accident  Nurse Case Manager Clinical Goal(s):  Marland Kitchen Over the next 30 days, patient will experience no symptoms suggestive of TIA and or CVA  . Over the next 90 days, patient will not experience any ED events and or IP admissions   CCM RN CM Interventions:  02/07/19 Placed outbound call to [place]   . Spoke with Awilda Bill NP [phone]; discussed she has just completed a home visit with Brett Mora and his family; discussed the family are all agreeable for patient transitioning to [place] and have signed a MOST form and a DNR . Informed Nancy Nordmann that provider Minette Brine, FNP sent over Hospice orders to [facility] yesterday (02/06/19)  . Care plan closed due to patient being admitted into Hospice Care  Patient Self Care Activities:  . Currently UNABLE TO independently unable to perform self care  Please see past updates related to this goal by  clicking on the "Past Updates" button in the selected goal      . COMPLETED: My grandfather needs help affording my medications       Grandson Brett Mora stated:  Current Barriers:  . Financial Barriers: patient has [employer] and reports copay for Amitiza is cost prohibitive at this time  Pharmacist Clinical Goal(s):  Marland Kitchen Over the next 30 days, patient will work with PharmD and providers to relieve medication access concerns  CCM RN CM Interventions: 02/07/19 Placed outbound call to [place]   . Spoke with Awilda Bill NP [phone]; discussed she has just completed a home visit with Brett Mora and his family; discussed the family are all agreeable for patient transitioning to [place] and have signed a MOST form and a DNR . Informed Nancy Nordmann that provider Minette Brine, FNP sent over Hospice orders to [facility] yesterday (02/06/19)  . Care plan closed due to patient being admitted into Hospice Care  Patient Self Care Activities:  . Patient will provide necessary  portions of application   Please see past updates related to this goal by clicking on the "Past Updates" button in the selected goal          No further follow up required: due to patient is being admitted into [facility], RN, BSN, CCM Care Management Coordinator [place] [facility]  Direct Phone: [phone]

## 2019-02-07 NOTE — Progress Notes (Signed)
[facility] Consult Note Telephone: [phone]  Fax: [phone]  PATIENT NAME: Brett Mora DOB: 11-28-1919 MRN: [id number]  PRIMARY CARE PROVIDER:   Minette Brine, FNP  REFERRING PROVIDER:  Minette Mora, [address] [place],  [place] [address]  RESPONSIBLE PARTY:   Self and grandson, Brett Mora [phone] Brett Mora, daughter [phone] Brett Mora, daughter H: [phone] C: [phone]  RECOMMENDATIONS/PLAN:  1. Advance Care Planning/Goals of Care: Patient and all responsible parties listed above were present during visit. They affirmed that goals include to maximize quality of life and symptom management. Educated on importance of advance directive, code status, end of life wishes with chronic progressive disease. MOST form signed today, electing comfort measures, antibiotics if indicated, IV fluids for a defined trial period and no tube feeding. Uploaded to [facility]. Family expressed appreciation for goals clarification and said they were ready for Hospice care.   Follow up Palliative Care Visit: Palliative care will continue to follow for goals of care clarification and symptom management..   I spent 55 minutes providing this consultation, from 9.30am to 10.25am. More than 50% of the time in this consultation was spent on coordinating advance care communication.   HISTORY OF PRESENT ILLNESS:  Brett Mora is a 85 y.o. year old male with multiple medical problems including HLD, HTN, iron deficiency anemia, left sided weakness post CVA. Palliative Care was asked to help address goals of care.  CODE STATUS: DNR  PPS: 30% HOSPICE ELIGIBILITY/DIAGNOSIS: TBD  PAST MEDICAL HISTORY:  Past Medical History:  Diagnosis Date  . Blood transfusion without reported diagnosis   . Hyperlipemia   . Hypertension   . Iron deficiency   . Stroke [facility])     SOCIAL HX:  Social History   Tobacco Use  . Smoking status: Former  Research scientist (life sciences)  . Smokeless tobacco: Never Used  Substance Use Topics  . Alcohol use: No    ALLERGIES: No Known Allergies   PERTINENT MEDICATIONS:  Outpatient Encounter Medications as of 02/07/2019  Medication Sig  . acetaminophen (TYLENOL) 325 MG tablet Take 650 mg by mouth every 6 (six) hours as needed for mild pain or headache.  Marland Kitchen amLODipine (NORVASC) 5 MG tablet TAKE 1 TABLET BY MOUTH EVERY DAY (Patient taking differently: Take 5 mg by mouth daily. )  . Calcium Carbonate-Vitamin D (CALCIUM 600+D) 600-400 MG-UNIT per tablet Take 1 tablet by mouth 3 (three) times daily with meals.   . Cholecalciferol (VITAMIN D PO) Take 1 tablet by mouth daily.  . diclofenac sodium (VOLTAREN) 1 % GEL Apply 2 g topically 4 (four) times daily. (Patient not taking: Reported on 02/04/2019)  . ferrous sulfate 325 (65 FE) MG tablet Take 1 tablet (325 mg total) by mouth daily.  . hydrochlorothiazide (HYDRODIURIL) 25 MG tablet TAKE 1 TABLET BY MOUTH EVERY DAY  . lubiprostone (AMITIZA) 8 MCG capsule Take 1 capsule (8 mcg total) by mouth 2 (two) times daily with a meal.  . simvastatin (ZOCOR) 20 MG tablet TAKE 1 TABLET EVERY DAY (Patient taking differently: Take 20 mg by mouth daily at 6 PM. )  . traMADol (ULTRAM) 50 MG tablet Take 1 tablet (50 mg total) by mouth every 6 (six) hours as needed.   No facility-administered encounter medications on file as of 02/07/2019.     PHYSICAL EXAM:   General: NAD Cardiovascular: denied chest pain Pulmonary: normal respiratory effort, no SOB or cough Skin: no rashes/wounds to exposed skin Neurological: Weakness but otherwise nonfocal  Brett Spray, NP

## 2019-02-07 NOTE — Progress Notes (Signed)
Chronic Care Management   Initial Visit Note  02/04/2019 Name: Brett Mora MRN: [id number] DOB: 16-May-1919  Referred by: Minette Brine, FNP Reason for referral : Chronic Care Management   Brett Mora is a 85 y.o. year old male who is a primary care patient of Minette Brine, [place]. The CCM team was consulted for assistance with chronic disease management and care coordination needs.   Review of patient status, including review of consultants reports, relevant laboratory and other test results, and collaboration with appropriate care team members and the patient's provider was performed as part of comprehensive patient evaluation and provision of chronic care management services.    I spoke with Brett Mora (patient's grandson) by telephone today.  Advanced Directives Status: N See Care Plan and [facility] application for related entries.   Medications: Outpatient Encounter Medications as of 02/04/2019  Medication Sig  . lubiprostone (AMITIZA) 8 MCG capsule Take 1 capsule (8 mcg total) by mouth 2 (two) times daily with a meal.  . acetaminophen (TYLENOL) 325 MG tablet Take 650 mg by mouth every 6 (six) hours as needed for mild pain or headache.  Marland Kitchen amLODipine (NORVASC) 5 MG tablet TAKE 1 TABLET BY MOUTH EVERY DAY (Patient taking differently: Take 5 mg by mouth daily. )  . Calcium Carbonate-Vitamin D (CALCIUM 600+D) 600-400 MG-UNIT per tablet Take 1 tablet by mouth 3 (three) times daily with meals.   . Cholecalciferol (VITAMIN D PO) Take 1 tablet by mouth daily.  . diclofenac sodium (VOLTAREN) 1 % GEL Apply 2 g topically 4 (four) times daily. (Patient not taking: Reported on 02/04/2019)  . ferrous sulfate 325 (65 FE) MG tablet Take 1 tablet (325 mg total) by mouth daily.  . hydrochlorothiazide (HYDRODIURIL) 25 MG tablet TAKE 1 TABLET BY MOUTH EVERY DAY  . simvastatin (ZOCOR) 20 MG tablet TAKE 1 TABLET EVERY DAY (Patient taking differently: Take 20 mg by mouth daily at 6 PM. )   No facility-administered  encounter medications on file as of 02/04/2019.      Objective:   Goals Addressed            This Visit's Progress   . COMPLETED: My grandfather needs help affording my medications       Talmage Nap stated:  Current Barriers:  . Financial Barriers: patient has [employer] and reports copay for Amitiza is cost prohibitive at this time  Pharmacist Clinical Goal(s):  Marland Kitchen Over the next 30 days, patient will work with PharmD and providers to relieve medication access concerns  CCM RN CM Interventions: 02/07/19 Placed outbound call to [facility]   . Spoke with Awilda Bill NP [phone]; discussed she has just completed a home visit with [name] and his family; discussed the family are all agreeable for patient transitioning to [place] and have signed a MOST form and a DNR . Informed Nancy Nordmann that provider Minette Brine, FNP sent over Hospice orders to [facility] yesterday (02/06/19)  . Care plan closed due to patient being admitted into Hospice Care  Patient Self Care Activities:  . Patient will provide necessary portions of application   Please see past updates related to this goal by clicking on the "Past Updates" button in the selected goal      . We need help affording my grandfather's medication.       Current Barriers:  . Financial Barriers: patient has [employer] and reports copay for Amitiza is cost prohibitive at this time  Pharmacist Clinical Goal(s):  .  Over the next 30 days, patient will work with PharmD and providers to relieve medication access concerns  Interventions: . Comprehensive medication review completed; medication list updated in electronic medical record.  Baker Pierini by Bernita Buffy: Patient meets income/out of pocket spend criteria for this medication's patient assistance program. Reviewed application process. Patient will provide proof of income, out of pocket spend report, and will sign application. Will  collaborate with primary care provider, Minette Brine, DNP, FNP for their portion of application. Once completed, will submit to [facility] patient assistance program.  Patient Self Care Activities:  . Patient will provide necessary portions of application   Initial goal documentation         Plan:   The care management team will reach out to the patient again over the next 3-4 weeks.  Regina Eck, PharmD, BCPS Clinical Pharmacist, [facility] [place]: [phone]

## 2019-02-10 ENCOUNTER — Encounter: Payer: Self-pay | Admitting: Nurse Practitioner

## 2019-02-11 ENCOUNTER — Ambulatory Visit: Payer: Self-pay

## 2019-02-11 DIAGNOSIS — I10 Essential (primary) hypertension: Secondary | ICD-10-CM

## 2019-02-11 DIAGNOSIS — E782 Mixed hyperlipidemia: Secondary | ICD-10-CM

## 2019-02-11 DIAGNOSIS — I69354 Hemiplegia and hemiparesis following cerebral infarction affecting left non-dominant side: Secondary | ICD-10-CM

## 2019-02-11 NOTE — Chronic Care Management (AMB) (Signed)
  Chronic Care Management   Follow Up Note   02/11/2019 Name: Amin Dorrough MRN: [id number] DOB: 11/02/1919  Referred by: Minette Brine, FNP Reason for referral : Chronic Care Management (CCM RN CM Telephone Follow up )   Jesiah Flaws is a 85 y.o. year old male who is a primary care patient of Minette Brine, [place]. The CCM team was consulted for assistance with chronic disease management and care coordination needs.    Review of patient status, including review of consultants reports, relevant laboratory and other test results, and collaboration with appropriate care team members and the patient's provider was performed as part of comprehensive patient evaluation and provision of chronic care management services.    I spoke with Delsa Sale from [facility] to confirm she has received the Hospice referral for Mr. Para and plans to schedule the intake visit for tomorrow, 02/12/19.    No further follow up required at this time   Barb Merino, RN, BSN, CCM Care Management Coordinator [place] [facility]  Direct Phone: [phone]

## 2019-02-12 ENCOUNTER — Ambulatory Visit: Payer: Self-pay

## 2019-02-12 DIAGNOSIS — I10 Essential (primary) hypertension: Secondary | ICD-10-CM

## 2019-02-12 DIAGNOSIS — E782 Mixed hyperlipidemia: Secondary | ICD-10-CM

## 2019-02-12 DIAGNOSIS — I69354 Hemiplegia and hemiparesis following cerebral infarction affecting left non-dominant side: Secondary | ICD-10-CM

## 2019-02-13 ENCOUNTER — Telehealth: Payer: Self-pay

## 2019-02-13 NOTE — Chronic Care Management (AMB) (Signed)
  Chronic Care Management   Follow Up Note   02/13/2019 Name: [name] MRN: [id number] DOB: 09/09/1919  Referred by: Minette Brine, FNP Reason for referral : Chronic Care Management (CCM RNCM Telephone Outreach )   Brett Mora is a 85 y.o. year old male who is a primary care patient of Minette Brine, [place]. The CCM team was consulted for assistance with chronic disease management and care coordination needs.    Review of patient status, including review of consultants reports, relevant laboratory and other test results, and collaboration with appropriate care team members and the patient's provider was performed as part of comprehensive patient evaluation and provision of chronic care management services.    I spoke with Phineas Real from [facility] to confirm Mr. Villapando has his Hospice intake visit for admission scheduled for 10 am this morning.   Outpatient Encounter Medications as of 02/12/2019  Medication Sig  . acetaminophen (TYLENOL) 325 MG tablet Take 650 mg by mouth every 6 (six) hours as needed for mild pain or headache.  Marland Kitchen amLODipine (NORVASC) 5 MG tablet TAKE 1 TABLET BY MOUTH EVERY DAY (Patient taking differently: Take 5 mg by mouth daily. )  . Calcium Carbonate-Vitamin D (CALCIUM 600+D) 600-400 MG-UNIT per tablet Take 1 tablet by mouth 3 (three) times daily with meals.   . Cholecalciferol (VITAMIN D PO) Take 1 tablet by mouth daily.  . diclofenac sodium (VOLTAREN) 1 % GEL Apply 2 g topically 4 (four) times daily. (Patient not taking: Reported on 02/04/2019)  . ferrous sulfate 325 (65 FE) MG tablet Take 1 tablet (325 mg total) by mouth daily.  . hydrochlorothiazide (HYDRODIURIL) 25 MG tablet TAKE 1 TABLET BY MOUTH EVERY DAY  . lubiprostone (AMITIZA) 8 MCG capsule Take 1 capsule (8 mcg total) by mouth 2 (two) times daily with a meal.  . simvastatin (ZOCOR) 20 MG tablet TAKE 1 TABLET EVERY DAY (Patient taking differently: Take 20 mg by mouth daily at 6 PM. )  . traMADol  (ULTRAM) 50 MG tablet Take 1 tablet (50 mg total) by mouth every 6 (six) hours as needed.   No facility-administered encounter medications on file as of 02/12/2019.     The patient/caregiver will follow up with CCM Pharm D Lottie Dawson for further assistance with Amitiza if appropriate following admission into Hospice.    Barb Merino, RN, BSN, CCM Care Management Coordinator [facility]  Direct Phone: [phone]

## 2019-02-14 DIAGNOSIS — D509 Iron deficiency anemia, unspecified: Secondary | ICD-10-CM | POA: Diagnosis not present

## 2019-02-14 DIAGNOSIS — I69391 Dysphagia following cerebral infarction: Secondary | ICD-10-CM | POA: Diagnosis not present

## 2019-02-14 DIAGNOSIS — I10 Essential (primary) hypertension: Secondary | ICD-10-CM | POA: Diagnosis not present

## 2019-02-14 DIAGNOSIS — J309 Allergic rhinitis, unspecified: Secondary | ICD-10-CM | POA: Diagnosis not present

## 2019-02-14 DIAGNOSIS — E785 Hyperlipidemia, unspecified: Secondary | ICD-10-CM | POA: Diagnosis not present

## 2019-02-14 DIAGNOSIS — F0150 Vascular dementia without behavioral disturbance: Secondary | ICD-10-CM | POA: Diagnosis not present

## 2019-02-14 DIAGNOSIS — R918 Other nonspecific abnormal finding of lung field: Secondary | ICD-10-CM | POA: Diagnosis not present

## 2019-02-14 DIAGNOSIS — C61 Malignant neoplasm of prostate: Secondary | ICD-10-CM | POA: Diagnosis not present

## 2019-02-14 DIAGNOSIS — I69354 Hemiplegia and hemiparesis following cerebral infarction affecting left non-dominant side: Secondary | ICD-10-CM | POA: Diagnosis not present

## 2019-02-14 DIAGNOSIS — I69318 Other symptoms and signs involving cognitive functions following cerebral infarction: Secondary | ICD-10-CM | POA: Diagnosis not present

## 2019-02-15 DIAGNOSIS — I10 Essential (primary) hypertension: Secondary | ICD-10-CM | POA: Diagnosis not present

## 2019-02-15 DIAGNOSIS — I69354 Hemiplegia and hemiparesis following cerebral infarction affecting left non-dominant side: Secondary | ICD-10-CM | POA: Diagnosis not present

## 2019-02-15 DIAGNOSIS — I69391 Dysphagia following cerebral infarction: Secondary | ICD-10-CM | POA: Diagnosis not present

## 2019-02-15 DIAGNOSIS — I69318 Other symptoms and signs involving cognitive functions following cerebral infarction: Secondary | ICD-10-CM | POA: Diagnosis not present

## 2019-02-15 DIAGNOSIS — F0150 Vascular dementia without behavioral disturbance: Secondary | ICD-10-CM | POA: Diagnosis not present

## 2019-02-15 DIAGNOSIS — E785 Hyperlipidemia, unspecified: Secondary | ICD-10-CM | POA: Diagnosis not present

## 2019-02-18 DIAGNOSIS — E785 Hyperlipidemia, unspecified: Secondary | ICD-10-CM | POA: Diagnosis not present

## 2019-02-18 DIAGNOSIS — F0150 Vascular dementia without behavioral disturbance: Secondary | ICD-10-CM | POA: Diagnosis not present

## 2019-02-18 DIAGNOSIS — I69391 Dysphagia following cerebral infarction: Secondary | ICD-10-CM | POA: Diagnosis not present

## 2019-02-18 DIAGNOSIS — I69318 Other symptoms and signs involving cognitive functions following cerebral infarction: Secondary | ICD-10-CM | POA: Diagnosis not present

## 2019-02-18 DIAGNOSIS — I69354 Hemiplegia and hemiparesis following cerebral infarction affecting left non-dominant side: Secondary | ICD-10-CM | POA: Diagnosis not present

## 2019-02-18 DIAGNOSIS — I10 Essential (primary) hypertension: Secondary | ICD-10-CM | POA: Diagnosis not present

## 2019-02-19 ENCOUNTER — Other Ambulatory Visit: Payer: Self-pay | Admitting: Nurse Practitioner

## 2019-02-20 ENCOUNTER — Telehealth: Payer: Self-pay

## 2019-02-20 NOTE — Telephone Encounter (Signed)
Brett Mora from [facility] called for pt stating they dont cover simvastatin so they wanted to go ahead and take him off of it but his Brett Mora did not want to just stop the medication without speaking with [place] first. She wanted to know if we could give her a call so she can notify him what we need to do.   I CALLED Brett Mora AND ADVISED HIM THAT Brett MOORE FNP-BC STATED HE IS OK TO TAKE MR.Rudge OFF OF THE SIMVASTATIN. Lonia Mad

## 2019-02-21 DIAGNOSIS — I69318 Other symptoms and signs involving cognitive functions following cerebral infarction: Secondary | ICD-10-CM | POA: Diagnosis not present

## 2019-02-21 DIAGNOSIS — I69391 Dysphagia following cerebral infarction: Secondary | ICD-10-CM | POA: Diagnosis not present

## 2019-02-21 DIAGNOSIS — F0150 Vascular dementia without behavioral disturbance: Secondary | ICD-10-CM | POA: Diagnosis not present

## 2019-02-21 DIAGNOSIS — E785 Hyperlipidemia, unspecified: Secondary | ICD-10-CM | POA: Diagnosis not present

## 2019-02-21 DIAGNOSIS — I69354 Hemiplegia and hemiparesis following cerebral infarction affecting left non-dominant side: Secondary | ICD-10-CM | POA: Diagnosis not present

## 2019-02-21 DIAGNOSIS — I10 Essential (primary) hypertension: Secondary | ICD-10-CM | POA: Diagnosis not present

## 2019-02-25 DIAGNOSIS — F0150 Vascular dementia without behavioral disturbance: Secondary | ICD-10-CM | POA: Diagnosis not present

## 2019-02-25 DIAGNOSIS — I69354 Hemiplegia and hemiparesis following cerebral infarction affecting left non-dominant side: Secondary | ICD-10-CM | POA: Diagnosis not present

## 2019-02-25 DIAGNOSIS — I69318 Other symptoms and signs involving cognitive functions following cerebral infarction: Secondary | ICD-10-CM | POA: Diagnosis not present

## 2019-02-25 DIAGNOSIS — E785 Hyperlipidemia, unspecified: Secondary | ICD-10-CM | POA: Diagnosis not present

## 2019-02-25 DIAGNOSIS — I10 Essential (primary) hypertension: Secondary | ICD-10-CM | POA: Diagnosis not present

## 2019-02-25 DIAGNOSIS — I69391 Dysphagia following cerebral infarction: Secondary | ICD-10-CM | POA: Diagnosis not present

## 2019-02-26 DIAGNOSIS — E785 Hyperlipidemia, unspecified: Secondary | ICD-10-CM | POA: Diagnosis not present

## 2019-02-26 DIAGNOSIS — I69318 Other symptoms and signs involving cognitive functions following cerebral infarction: Secondary | ICD-10-CM | POA: Diagnosis not present

## 2019-02-26 DIAGNOSIS — F0150 Vascular dementia without behavioral disturbance: Secondary | ICD-10-CM | POA: Diagnosis not present

## 2019-02-26 DIAGNOSIS — I10 Essential (primary) hypertension: Secondary | ICD-10-CM | POA: Diagnosis not present

## 2019-02-26 DIAGNOSIS — I69354 Hemiplegia and hemiparesis following cerebral infarction affecting left non-dominant side: Secondary | ICD-10-CM | POA: Diagnosis not present

## 2019-02-26 DIAGNOSIS — I69391 Dysphagia following cerebral infarction: Secondary | ICD-10-CM | POA: Diagnosis not present

## 2019-02-28 DIAGNOSIS — I69391 Dysphagia following cerebral infarction: Secondary | ICD-10-CM | POA: Diagnosis not present

## 2019-02-28 DIAGNOSIS — E785 Hyperlipidemia, unspecified: Secondary | ICD-10-CM | POA: Diagnosis not present

## 2019-02-28 DIAGNOSIS — F0150 Vascular dementia without behavioral disturbance: Secondary | ICD-10-CM | POA: Diagnosis not present

## 2019-02-28 DIAGNOSIS — I69354 Hemiplegia and hemiparesis following cerebral infarction affecting left non-dominant side: Secondary | ICD-10-CM | POA: Diagnosis not present

## 2019-02-28 DIAGNOSIS — I10 Essential (primary) hypertension: Secondary | ICD-10-CM | POA: Diagnosis not present

## 2019-02-28 DIAGNOSIS — I69318 Other symptoms and signs involving cognitive functions following cerebral infarction: Secondary | ICD-10-CM | POA: Diagnosis not present

## 2019-03-01 ENCOUNTER — Telehealth: Payer: Self-pay

## 2019-03-05 DIAGNOSIS — F0150 Vascular dementia without behavioral disturbance: Secondary | ICD-10-CM | POA: Diagnosis not present

## 2019-03-05 DIAGNOSIS — I69318 Other symptoms and signs involving cognitive functions following cerebral infarction: Secondary | ICD-10-CM | POA: Diagnosis not present

## 2019-03-05 DIAGNOSIS — I69391 Dysphagia following cerebral infarction: Secondary | ICD-10-CM | POA: Diagnosis not present

## 2019-03-05 DIAGNOSIS — I10 Essential (primary) hypertension: Secondary | ICD-10-CM | POA: Diagnosis not present

## 2019-03-05 DIAGNOSIS — E785 Hyperlipidemia, unspecified: Secondary | ICD-10-CM | POA: Diagnosis not present

## 2019-03-05 DIAGNOSIS — I69354 Hemiplegia and hemiparesis following cerebral infarction affecting left non-dominant side: Secondary | ICD-10-CM | POA: Diagnosis not present

## 2019-03-07 DIAGNOSIS — E785 Hyperlipidemia, unspecified: Secondary | ICD-10-CM | POA: Diagnosis not present

## 2019-03-07 DIAGNOSIS — F0150 Vascular dementia without behavioral disturbance: Secondary | ICD-10-CM | POA: Diagnosis not present

## 2019-03-07 DIAGNOSIS — I69354 Hemiplegia and hemiparesis following cerebral infarction affecting left non-dominant side: Secondary | ICD-10-CM | POA: Diagnosis not present

## 2019-03-07 DIAGNOSIS — I69318 Other symptoms and signs involving cognitive functions following cerebral infarction: Secondary | ICD-10-CM | POA: Diagnosis not present

## 2019-03-07 DIAGNOSIS — I69391 Dysphagia following cerebral infarction: Secondary | ICD-10-CM | POA: Diagnosis not present

## 2019-03-07 DIAGNOSIS — I10 Essential (primary) hypertension: Secondary | ICD-10-CM | POA: Diagnosis not present

## 2019-03-08 DIAGNOSIS — E785 Hyperlipidemia, unspecified: Secondary | ICD-10-CM | POA: Diagnosis not present

## 2019-03-08 DIAGNOSIS — I69391 Dysphagia following cerebral infarction: Secondary | ICD-10-CM | POA: Diagnosis not present

## 2019-03-08 DIAGNOSIS — I69354 Hemiplegia and hemiparesis following cerebral infarction affecting left non-dominant side: Secondary | ICD-10-CM | POA: Diagnosis not present

## 2019-03-08 DIAGNOSIS — I69318 Other symptoms and signs involving cognitive functions following cerebral infarction: Secondary | ICD-10-CM | POA: Diagnosis not present

## 2019-03-08 DIAGNOSIS — F0150 Vascular dementia without behavioral disturbance: Secondary | ICD-10-CM | POA: Diagnosis not present

## 2019-03-08 DIAGNOSIS — I10 Essential (primary) hypertension: Secondary | ICD-10-CM | POA: Diagnosis not present

## 2019-03-09 ENCOUNTER — Other Ambulatory Visit: Payer: Self-pay | Admitting: Nurse Practitioner

## 2019-03-10 DIAGNOSIS — D509 Iron deficiency anemia, unspecified: Secondary | ICD-10-CM | POA: Diagnosis not present

## 2019-03-10 DIAGNOSIS — I69318 Other symptoms and signs involving cognitive functions following cerebral infarction: Secondary | ICD-10-CM | POA: Diagnosis not present

## 2019-03-10 DIAGNOSIS — I69391 Dysphagia following cerebral infarction: Secondary | ICD-10-CM | POA: Diagnosis not present

## 2019-03-10 DIAGNOSIS — I10 Essential (primary) hypertension: Secondary | ICD-10-CM | POA: Diagnosis not present

## 2019-03-10 DIAGNOSIS — J309 Allergic rhinitis, unspecified: Secondary | ICD-10-CM | POA: Diagnosis not present

## 2019-03-10 DIAGNOSIS — F0150 Vascular dementia without behavioral disturbance: Secondary | ICD-10-CM | POA: Diagnosis not present

## 2019-03-10 DIAGNOSIS — E785 Hyperlipidemia, unspecified: Secondary | ICD-10-CM | POA: Diagnosis not present

## 2019-03-10 DIAGNOSIS — R918 Other nonspecific abnormal finding of lung field: Secondary | ICD-10-CM | POA: Diagnosis not present

## 2019-03-10 DIAGNOSIS — I69354 Hemiplegia and hemiparesis following cerebral infarction affecting left non-dominant side: Secondary | ICD-10-CM | POA: Diagnosis not present

## 2019-03-10 DIAGNOSIS — C61 Malignant neoplasm of prostate: Secondary | ICD-10-CM | POA: Diagnosis not present

## 2019-03-11 DIAGNOSIS — I69318 Other symptoms and signs involving cognitive functions following cerebral infarction: Secondary | ICD-10-CM | POA: Diagnosis not present

## 2019-03-11 DIAGNOSIS — E785 Hyperlipidemia, unspecified: Secondary | ICD-10-CM | POA: Diagnosis not present

## 2019-03-11 DIAGNOSIS — R0789 Other chest pain: Secondary | ICD-10-CM | POA: Diagnosis not present

## 2019-03-11 DIAGNOSIS — R450 Nervousness: Secondary | ICD-10-CM | POA: Diagnosis not present

## 2019-03-11 DIAGNOSIS — I491 Atrial premature depolarization: Secondary | ICD-10-CM | POA: Diagnosis not present

## 2019-03-11 DIAGNOSIS — F0150 Vascular dementia without behavioral disturbance: Secondary | ICD-10-CM | POA: Diagnosis not present

## 2019-03-11 DIAGNOSIS — I69391 Dysphagia following cerebral infarction: Secondary | ICD-10-CM | POA: Diagnosis not present

## 2019-03-11 DIAGNOSIS — I69354 Hemiplegia and hemiparesis following cerebral infarction affecting left non-dominant side: Secondary | ICD-10-CM | POA: Diagnosis not present

## 2019-03-11 DIAGNOSIS — I10 Essential (primary) hypertension: Secondary | ICD-10-CM | POA: Diagnosis not present

## 2019-03-11 DIAGNOSIS — R079 Chest pain, unspecified: Secondary | ICD-10-CM | POA: Diagnosis not present

## 2019-03-12 DIAGNOSIS — I69354 Hemiplegia and hemiparesis following cerebral infarction affecting left non-dominant side: Secondary | ICD-10-CM | POA: Diagnosis not present

## 2019-03-12 DIAGNOSIS — I69391 Dysphagia following cerebral infarction: Secondary | ICD-10-CM | POA: Diagnosis not present

## 2019-03-12 DIAGNOSIS — F0150 Vascular dementia without behavioral disturbance: Secondary | ICD-10-CM | POA: Diagnosis not present

## 2019-03-12 DIAGNOSIS — I69318 Other symptoms and signs involving cognitive functions following cerebral infarction: Secondary | ICD-10-CM | POA: Diagnosis not present

## 2019-03-12 DIAGNOSIS — E785 Hyperlipidemia, unspecified: Secondary | ICD-10-CM | POA: Diagnosis not present

## 2019-03-12 DIAGNOSIS — I10 Essential (primary) hypertension: Secondary | ICD-10-CM | POA: Diagnosis not present

## 2019-03-13 DIAGNOSIS — F0150 Vascular dementia without behavioral disturbance: Secondary | ICD-10-CM | POA: Diagnosis not present

## 2019-03-13 DIAGNOSIS — E785 Hyperlipidemia, unspecified: Secondary | ICD-10-CM | POA: Diagnosis not present

## 2019-03-13 DIAGNOSIS — I10 Essential (primary) hypertension: Secondary | ICD-10-CM | POA: Diagnosis not present

## 2019-03-13 DIAGNOSIS — I69354 Hemiplegia and hemiparesis following cerebral infarction affecting left non-dominant side: Secondary | ICD-10-CM | POA: Diagnosis not present

## 2019-03-13 DIAGNOSIS — I69391 Dysphagia following cerebral infarction: Secondary | ICD-10-CM | POA: Diagnosis not present

## 2019-03-13 DIAGNOSIS — I69318 Other symptoms and signs involving cognitive functions following cerebral infarction: Secondary | ICD-10-CM | POA: Diagnosis not present

## 2019-03-19 DIAGNOSIS — I10 Essential (primary) hypertension: Secondary | ICD-10-CM | POA: Diagnosis not present

## 2019-03-19 DIAGNOSIS — I69391 Dysphagia following cerebral infarction: Secondary | ICD-10-CM | POA: Diagnosis not present

## 2019-03-19 DIAGNOSIS — E785 Hyperlipidemia, unspecified: Secondary | ICD-10-CM | POA: Diagnosis not present

## 2019-03-19 DIAGNOSIS — I69354 Hemiplegia and hemiparesis following cerebral infarction affecting left non-dominant side: Secondary | ICD-10-CM | POA: Diagnosis not present

## 2019-03-19 DIAGNOSIS — I69318 Other symptoms and signs involving cognitive functions following cerebral infarction: Secondary | ICD-10-CM | POA: Diagnosis not present

## 2019-03-19 DIAGNOSIS — F0150 Vascular dementia without behavioral disturbance: Secondary | ICD-10-CM | POA: Diagnosis not present

## 2019-03-20 DIAGNOSIS — E785 Hyperlipidemia, unspecified: Secondary | ICD-10-CM | POA: Diagnosis not present

## 2019-03-20 DIAGNOSIS — I69354 Hemiplegia and hemiparesis following cerebral infarction affecting left non-dominant side: Secondary | ICD-10-CM | POA: Diagnosis not present

## 2019-03-20 DIAGNOSIS — I69318 Other symptoms and signs involving cognitive functions following cerebral infarction: Secondary | ICD-10-CM | POA: Diagnosis not present

## 2019-03-20 DIAGNOSIS — I10 Essential (primary) hypertension: Secondary | ICD-10-CM | POA: Diagnosis not present

## 2019-03-20 DIAGNOSIS — F0150 Vascular dementia without behavioral disturbance: Secondary | ICD-10-CM | POA: Diagnosis not present

## 2019-03-20 DIAGNOSIS — I69391 Dysphagia following cerebral infarction: Secondary | ICD-10-CM | POA: Diagnosis not present

## 2019-03-21 DIAGNOSIS — I69391 Dysphagia following cerebral infarction: Secondary | ICD-10-CM | POA: Diagnosis not present

## 2019-03-21 DIAGNOSIS — I69354 Hemiplegia and hemiparesis following cerebral infarction affecting left non-dominant side: Secondary | ICD-10-CM | POA: Diagnosis not present

## 2019-03-21 DIAGNOSIS — E785 Hyperlipidemia, unspecified: Secondary | ICD-10-CM | POA: Diagnosis not present

## 2019-03-21 DIAGNOSIS — F0150 Vascular dementia without behavioral disturbance: Secondary | ICD-10-CM | POA: Diagnosis not present

## 2019-03-21 DIAGNOSIS — I69318 Other symptoms and signs involving cognitive functions following cerebral infarction: Secondary | ICD-10-CM | POA: Diagnosis not present

## 2019-03-21 DIAGNOSIS — I10 Essential (primary) hypertension: Secondary | ICD-10-CM | POA: Diagnosis not present

## 2019-03-26 ENCOUNTER — Other Ambulatory Visit: Payer: Self-pay | Admitting: Nurse Practitioner

## 2019-03-26 DIAGNOSIS — D509 Iron deficiency anemia, unspecified: Secondary | ICD-10-CM

## 2019-03-27 DIAGNOSIS — I69354 Hemiplegia and hemiparesis following cerebral infarction affecting left non-dominant side: Secondary | ICD-10-CM | POA: Diagnosis not present

## 2019-03-27 DIAGNOSIS — I69391 Dysphagia following cerebral infarction: Secondary | ICD-10-CM | POA: Diagnosis not present

## 2019-03-27 DIAGNOSIS — I10 Essential (primary) hypertension: Secondary | ICD-10-CM | POA: Diagnosis not present

## 2019-03-27 DIAGNOSIS — F0150 Vascular dementia without behavioral disturbance: Secondary | ICD-10-CM | POA: Diagnosis not present

## 2019-03-27 DIAGNOSIS — I69318 Other symptoms and signs involving cognitive functions following cerebral infarction: Secondary | ICD-10-CM | POA: Diagnosis not present

## 2019-03-27 DIAGNOSIS — E785 Hyperlipidemia, unspecified: Secondary | ICD-10-CM | POA: Diagnosis not present

## 2019-03-28 DIAGNOSIS — I10 Essential (primary) hypertension: Secondary | ICD-10-CM | POA: Diagnosis not present

## 2019-03-28 DIAGNOSIS — F0150 Vascular dementia without behavioral disturbance: Secondary | ICD-10-CM | POA: Diagnosis not present

## 2019-03-28 DIAGNOSIS — E785 Hyperlipidemia, unspecified: Secondary | ICD-10-CM | POA: Diagnosis not present

## 2019-03-28 DIAGNOSIS — I69354 Hemiplegia and hemiparesis following cerebral infarction affecting left non-dominant side: Secondary | ICD-10-CM | POA: Diagnosis not present

## 2019-03-28 DIAGNOSIS — I69318 Other symptoms and signs involving cognitive functions following cerebral infarction: Secondary | ICD-10-CM | POA: Diagnosis not present

## 2019-03-28 DIAGNOSIS — I69391 Dysphagia following cerebral infarction: Secondary | ICD-10-CM | POA: Diagnosis not present

## 2019-03-29 DIAGNOSIS — I69391 Dysphagia following cerebral infarction: Secondary | ICD-10-CM | POA: Diagnosis not present

## 2019-03-29 DIAGNOSIS — F0150 Vascular dementia without behavioral disturbance: Secondary | ICD-10-CM | POA: Diagnosis not present

## 2019-03-29 DIAGNOSIS — I10 Essential (primary) hypertension: Secondary | ICD-10-CM | POA: Diagnosis not present

## 2019-03-29 DIAGNOSIS — I69318 Other symptoms and signs involving cognitive functions following cerebral infarction: Secondary | ICD-10-CM | POA: Diagnosis not present

## 2019-03-29 DIAGNOSIS — I69354 Hemiplegia and hemiparesis following cerebral infarction affecting left non-dominant side: Secondary | ICD-10-CM | POA: Diagnosis not present

## 2019-03-29 DIAGNOSIS — E785 Hyperlipidemia, unspecified: Secondary | ICD-10-CM | POA: Diagnosis not present

## 2019-04-01 DIAGNOSIS — F0150 Vascular dementia without behavioral disturbance: Secondary | ICD-10-CM | POA: Diagnosis not present

## 2019-04-01 DIAGNOSIS — I69354 Hemiplegia and hemiparesis following cerebral infarction affecting left non-dominant side: Secondary | ICD-10-CM | POA: Diagnosis not present

## 2019-04-01 DIAGNOSIS — I69318 Other symptoms and signs involving cognitive functions following cerebral infarction: Secondary | ICD-10-CM | POA: Diagnosis not present

## 2019-04-01 DIAGNOSIS — E785 Hyperlipidemia, unspecified: Secondary | ICD-10-CM | POA: Diagnosis not present

## 2019-04-01 DIAGNOSIS — I10 Essential (primary) hypertension: Secondary | ICD-10-CM | POA: Diagnosis not present

## 2019-04-01 DIAGNOSIS — I69391 Dysphagia following cerebral infarction: Secondary | ICD-10-CM | POA: Diagnosis not present

## 2019-04-02 DIAGNOSIS — I69391 Dysphagia following cerebral infarction: Secondary | ICD-10-CM | POA: Diagnosis not present

## 2019-04-02 DIAGNOSIS — I69318 Other symptoms and signs involving cognitive functions following cerebral infarction: Secondary | ICD-10-CM | POA: Diagnosis not present

## 2019-04-02 DIAGNOSIS — E785 Hyperlipidemia, unspecified: Secondary | ICD-10-CM | POA: Diagnosis not present

## 2019-04-02 DIAGNOSIS — I69354 Hemiplegia and hemiparesis following cerebral infarction affecting left non-dominant side: Secondary | ICD-10-CM | POA: Diagnosis not present

## 2019-04-02 DIAGNOSIS — F0150 Vascular dementia without behavioral disturbance: Secondary | ICD-10-CM | POA: Diagnosis not present

## 2019-04-02 DIAGNOSIS — I10 Essential (primary) hypertension: Secondary | ICD-10-CM | POA: Diagnosis not present

## 2019-04-03 DIAGNOSIS — I10 Essential (primary) hypertension: Secondary | ICD-10-CM | POA: Diagnosis not present

## 2019-04-03 DIAGNOSIS — E785 Hyperlipidemia, unspecified: Secondary | ICD-10-CM | POA: Diagnosis not present

## 2019-04-03 DIAGNOSIS — I69354 Hemiplegia and hemiparesis following cerebral infarction affecting left non-dominant side: Secondary | ICD-10-CM | POA: Diagnosis not present

## 2019-04-03 DIAGNOSIS — I69391 Dysphagia following cerebral infarction: Secondary | ICD-10-CM | POA: Diagnosis not present

## 2019-04-03 DIAGNOSIS — F0150 Vascular dementia without behavioral disturbance: Secondary | ICD-10-CM | POA: Diagnosis not present

## 2019-04-03 DIAGNOSIS — I69318 Other symptoms and signs involving cognitive functions following cerebral infarction: Secondary | ICD-10-CM | POA: Diagnosis not present

## 2019-04-08 DIAGNOSIS — F0150 Vascular dementia without behavioral disturbance: Secondary | ICD-10-CM | POA: Diagnosis not present

## 2019-04-08 DIAGNOSIS — I69354 Hemiplegia and hemiparesis following cerebral infarction affecting left non-dominant side: Secondary | ICD-10-CM | POA: Diagnosis not present

## 2019-04-08 DIAGNOSIS — I69391 Dysphagia following cerebral infarction: Secondary | ICD-10-CM | POA: Diagnosis not present

## 2019-04-08 DIAGNOSIS — E785 Hyperlipidemia, unspecified: Secondary | ICD-10-CM | POA: Diagnosis not present

## 2019-04-08 DIAGNOSIS — I10 Essential (primary) hypertension: Secondary | ICD-10-CM | POA: Diagnosis not present

## 2019-04-08 DIAGNOSIS — I69318 Other symptoms and signs involving cognitive functions following cerebral infarction: Secondary | ICD-10-CM | POA: Diagnosis not present

## 2019-04-09 DIAGNOSIS — I10 Essential (primary) hypertension: Secondary | ICD-10-CM | POA: Diagnosis not present

## 2019-04-09 DIAGNOSIS — I69391 Dysphagia following cerebral infarction: Secondary | ICD-10-CM | POA: Diagnosis not present

## 2019-04-09 DIAGNOSIS — F0150 Vascular dementia without behavioral disturbance: Secondary | ICD-10-CM | POA: Diagnosis not present

## 2019-04-09 DIAGNOSIS — I69318 Other symptoms and signs involving cognitive functions following cerebral infarction: Secondary | ICD-10-CM | POA: Diagnosis not present

## 2019-04-09 DIAGNOSIS — J309 Allergic rhinitis, unspecified: Secondary | ICD-10-CM | POA: Diagnosis not present

## 2019-04-09 DIAGNOSIS — E785 Hyperlipidemia, unspecified: Secondary | ICD-10-CM | POA: Diagnosis not present

## 2019-04-09 DIAGNOSIS — R918 Other nonspecific abnormal finding of lung field: Secondary | ICD-10-CM | POA: Diagnosis not present

## 2019-04-09 DIAGNOSIS — D509 Iron deficiency anemia, unspecified: Secondary | ICD-10-CM | POA: Diagnosis not present

## 2019-04-09 DIAGNOSIS — C61 Malignant neoplasm of prostate: Secondary | ICD-10-CM | POA: Diagnosis not present

## 2019-04-09 DIAGNOSIS — I69354 Hemiplegia and hemiparesis following cerebral infarction affecting left non-dominant side: Secondary | ICD-10-CM | POA: Diagnosis not present

## 2019-04-12 DIAGNOSIS — I69318 Other symptoms and signs involving cognitive functions following cerebral infarction: Secondary | ICD-10-CM | POA: Diagnosis not present

## 2019-04-12 DIAGNOSIS — F0150 Vascular dementia without behavioral disturbance: Secondary | ICD-10-CM | POA: Diagnosis not present

## 2019-04-12 DIAGNOSIS — I69354 Hemiplegia and hemiparesis following cerebral infarction affecting left non-dominant side: Secondary | ICD-10-CM | POA: Diagnosis not present

## 2019-04-12 DIAGNOSIS — E785 Hyperlipidemia, unspecified: Secondary | ICD-10-CM | POA: Diagnosis not present

## 2019-04-12 DIAGNOSIS — I69391 Dysphagia following cerebral infarction: Secondary | ICD-10-CM | POA: Diagnosis not present

## 2019-04-12 DIAGNOSIS — I10 Essential (primary) hypertension: Secondary | ICD-10-CM | POA: Diagnosis not present

## 2019-04-15 DIAGNOSIS — I69318 Other symptoms and signs involving cognitive functions following cerebral infarction: Secondary | ICD-10-CM | POA: Diagnosis not present

## 2019-04-15 DIAGNOSIS — I10 Essential (primary) hypertension: Secondary | ICD-10-CM | POA: Diagnosis not present

## 2019-04-15 DIAGNOSIS — I69354 Hemiplegia and hemiparesis following cerebral infarction affecting left non-dominant side: Secondary | ICD-10-CM | POA: Diagnosis not present

## 2019-04-15 DIAGNOSIS — E785 Hyperlipidemia, unspecified: Secondary | ICD-10-CM | POA: Diagnosis not present

## 2019-04-15 DIAGNOSIS — F0150 Vascular dementia without behavioral disturbance: Secondary | ICD-10-CM | POA: Diagnosis not present

## 2019-04-15 DIAGNOSIS — I69391 Dysphagia following cerebral infarction: Secondary | ICD-10-CM | POA: Diagnosis not present

## 2019-04-17 DIAGNOSIS — E785 Hyperlipidemia, unspecified: Secondary | ICD-10-CM | POA: Diagnosis not present

## 2019-04-17 DIAGNOSIS — I69354 Hemiplegia and hemiparesis following cerebral infarction affecting left non-dominant side: Secondary | ICD-10-CM | POA: Diagnosis not present

## 2019-04-17 DIAGNOSIS — I69391 Dysphagia following cerebral infarction: Secondary | ICD-10-CM | POA: Diagnosis not present

## 2019-04-17 DIAGNOSIS — F0150 Vascular dementia without behavioral disturbance: Secondary | ICD-10-CM | POA: Diagnosis not present

## 2019-04-17 DIAGNOSIS — I10 Essential (primary) hypertension: Secondary | ICD-10-CM | POA: Diagnosis not present

## 2019-04-17 DIAGNOSIS — I69318 Other symptoms and signs involving cognitive functions following cerebral infarction: Secondary | ICD-10-CM | POA: Diagnosis not present

## 2019-04-18 DIAGNOSIS — F0150 Vascular dementia without behavioral disturbance: Secondary | ICD-10-CM | POA: Diagnosis not present

## 2019-04-18 DIAGNOSIS — I69354 Hemiplegia and hemiparesis following cerebral infarction affecting left non-dominant side: Secondary | ICD-10-CM | POA: Diagnosis not present

## 2019-04-18 DIAGNOSIS — I10 Essential (primary) hypertension: Secondary | ICD-10-CM | POA: Diagnosis not present

## 2019-04-18 DIAGNOSIS — I69318 Other symptoms and signs involving cognitive functions following cerebral infarction: Secondary | ICD-10-CM | POA: Diagnosis not present

## 2019-04-18 DIAGNOSIS — I69391 Dysphagia following cerebral infarction: Secondary | ICD-10-CM | POA: Diagnosis not present

## 2019-04-18 DIAGNOSIS — E785 Hyperlipidemia, unspecified: Secondary | ICD-10-CM | POA: Diagnosis not present

## 2019-04-19 DIAGNOSIS — I69354 Hemiplegia and hemiparesis following cerebral infarction affecting left non-dominant side: Secondary | ICD-10-CM | POA: Diagnosis not present

## 2019-04-19 DIAGNOSIS — F0150 Vascular dementia without behavioral disturbance: Secondary | ICD-10-CM | POA: Diagnosis not present

## 2019-04-19 DIAGNOSIS — E785 Hyperlipidemia, unspecified: Secondary | ICD-10-CM | POA: Diagnosis not present

## 2019-04-19 DIAGNOSIS — I69318 Other symptoms and signs involving cognitive functions following cerebral infarction: Secondary | ICD-10-CM | POA: Diagnosis not present

## 2019-04-19 DIAGNOSIS — I10 Essential (primary) hypertension: Secondary | ICD-10-CM | POA: Diagnosis not present

## 2019-04-19 DIAGNOSIS — I69391 Dysphagia following cerebral infarction: Secondary | ICD-10-CM | POA: Diagnosis not present

## 2019-04-22 DIAGNOSIS — I69391 Dysphagia following cerebral infarction: Secondary | ICD-10-CM | POA: Diagnosis not present

## 2019-04-22 DIAGNOSIS — I10 Essential (primary) hypertension: Secondary | ICD-10-CM | POA: Diagnosis not present

## 2019-04-22 DIAGNOSIS — I69318 Other symptoms and signs involving cognitive functions following cerebral infarction: Secondary | ICD-10-CM | POA: Diagnosis not present

## 2019-04-22 DIAGNOSIS — F0150 Vascular dementia without behavioral disturbance: Secondary | ICD-10-CM | POA: Diagnosis not present

## 2019-04-22 DIAGNOSIS — E785 Hyperlipidemia, unspecified: Secondary | ICD-10-CM | POA: Diagnosis not present

## 2019-04-22 DIAGNOSIS — I69354 Hemiplegia and hemiparesis following cerebral infarction affecting left non-dominant side: Secondary | ICD-10-CM | POA: Diagnosis not present

## 2019-04-23 ENCOUNTER — Telehealth: Payer: Self-pay

## 2019-04-23 DIAGNOSIS — I69391 Dysphagia following cerebral infarction: Secondary | ICD-10-CM | POA: Diagnosis not present

## 2019-04-23 DIAGNOSIS — I69354 Hemiplegia and hemiparesis following cerebral infarction affecting left non-dominant side: Secondary | ICD-10-CM | POA: Diagnosis not present

## 2019-04-23 DIAGNOSIS — I10 Essential (primary) hypertension: Secondary | ICD-10-CM | POA: Diagnosis not present

## 2019-04-23 DIAGNOSIS — I69318 Other symptoms and signs involving cognitive functions following cerebral infarction: Secondary | ICD-10-CM | POA: Diagnosis not present

## 2019-04-23 DIAGNOSIS — E785 Hyperlipidemia, unspecified: Secondary | ICD-10-CM | POA: Diagnosis not present

## 2019-04-23 DIAGNOSIS — F0150 Vascular dementia without behavioral disturbance: Secondary | ICD-10-CM | POA: Diagnosis not present

## 2019-04-23 NOTE — Telephone Encounter (Signed)
Brett Mora from [facility] called stating pt is due for reverification of their services and they need verbal orders to continue care. [phone] YRL,RMA   I returned her call and gave ok to verbal order. YRL,RMA

## 2019-04-24 DIAGNOSIS — I69354 Hemiplegia and hemiparesis following cerebral infarction affecting left non-dominant side: Secondary | ICD-10-CM | POA: Diagnosis not present

## 2019-04-24 DIAGNOSIS — I10 Essential (primary) hypertension: Secondary | ICD-10-CM | POA: Diagnosis not present

## 2019-04-24 DIAGNOSIS — I69318 Other symptoms and signs involving cognitive functions following cerebral infarction: Secondary | ICD-10-CM | POA: Diagnosis not present

## 2019-04-24 DIAGNOSIS — I69391 Dysphagia following cerebral infarction: Secondary | ICD-10-CM | POA: Diagnosis not present

## 2019-04-24 DIAGNOSIS — F0150 Vascular dementia without behavioral disturbance: Secondary | ICD-10-CM | POA: Diagnosis not present

## 2019-04-24 DIAGNOSIS — E785 Hyperlipidemia, unspecified: Secondary | ICD-10-CM | POA: Diagnosis not present

## 2019-04-25 DIAGNOSIS — F0150 Vascular dementia without behavioral disturbance: Secondary | ICD-10-CM | POA: Diagnosis not present

## 2019-04-25 DIAGNOSIS — I69318 Other symptoms and signs involving cognitive functions following cerebral infarction: Secondary | ICD-10-CM | POA: Diagnosis not present

## 2019-04-25 DIAGNOSIS — I69391 Dysphagia following cerebral infarction: Secondary | ICD-10-CM | POA: Diagnosis not present

## 2019-04-25 DIAGNOSIS — E785 Hyperlipidemia, unspecified: Secondary | ICD-10-CM | POA: Diagnosis not present

## 2019-04-25 DIAGNOSIS — I10 Essential (primary) hypertension: Secondary | ICD-10-CM | POA: Diagnosis not present

## 2019-04-25 DIAGNOSIS — I69354 Hemiplegia and hemiparesis following cerebral infarction affecting left non-dominant side: Secondary | ICD-10-CM | POA: Diagnosis not present

## 2019-04-26 DIAGNOSIS — I69318 Other symptoms and signs involving cognitive functions following cerebral infarction: Secondary | ICD-10-CM | POA: Diagnosis not present

## 2019-04-26 DIAGNOSIS — F0150 Vascular dementia without behavioral disturbance: Secondary | ICD-10-CM | POA: Diagnosis not present

## 2019-04-26 DIAGNOSIS — I69391 Dysphagia following cerebral infarction: Secondary | ICD-10-CM | POA: Diagnosis not present

## 2019-04-26 DIAGNOSIS — I10 Essential (primary) hypertension: Secondary | ICD-10-CM | POA: Diagnosis not present

## 2019-04-26 DIAGNOSIS — E785 Hyperlipidemia, unspecified: Secondary | ICD-10-CM | POA: Diagnosis not present

## 2019-04-26 DIAGNOSIS — I69354 Hemiplegia and hemiparesis following cerebral infarction affecting left non-dominant side: Secondary | ICD-10-CM | POA: Diagnosis not present

## 2019-04-29 DIAGNOSIS — I69354 Hemiplegia and hemiparesis following cerebral infarction affecting left non-dominant side: Secondary | ICD-10-CM | POA: Diagnosis not present

## 2019-04-29 DIAGNOSIS — F0150 Vascular dementia without behavioral disturbance: Secondary | ICD-10-CM | POA: Diagnosis not present

## 2019-04-29 DIAGNOSIS — E785 Hyperlipidemia, unspecified: Secondary | ICD-10-CM | POA: Diagnosis not present

## 2019-04-29 DIAGNOSIS — I10 Essential (primary) hypertension: Secondary | ICD-10-CM | POA: Diagnosis not present

## 2019-04-29 DIAGNOSIS — I69391 Dysphagia following cerebral infarction: Secondary | ICD-10-CM | POA: Diagnosis not present

## 2019-04-29 DIAGNOSIS — I69318 Other symptoms and signs involving cognitive functions following cerebral infarction: Secondary | ICD-10-CM | POA: Diagnosis not present

## 2019-04-30 DIAGNOSIS — I69391 Dysphagia following cerebral infarction: Secondary | ICD-10-CM | POA: Diagnosis not present

## 2019-04-30 DIAGNOSIS — E785 Hyperlipidemia, unspecified: Secondary | ICD-10-CM | POA: Diagnosis not present

## 2019-04-30 DIAGNOSIS — I10 Essential (primary) hypertension: Secondary | ICD-10-CM | POA: Diagnosis not present

## 2019-04-30 DIAGNOSIS — F0150 Vascular dementia without behavioral disturbance: Secondary | ICD-10-CM | POA: Diagnosis not present

## 2019-04-30 DIAGNOSIS — I69318 Other symptoms and signs involving cognitive functions following cerebral infarction: Secondary | ICD-10-CM | POA: Diagnosis not present

## 2019-04-30 DIAGNOSIS — I69354 Hemiplegia and hemiparesis following cerebral infarction affecting left non-dominant side: Secondary | ICD-10-CM | POA: Diagnosis not present

## 2019-05-01 DIAGNOSIS — I10 Essential (primary) hypertension: Secondary | ICD-10-CM | POA: Diagnosis not present

## 2019-05-01 DIAGNOSIS — I69391 Dysphagia following cerebral infarction: Secondary | ICD-10-CM | POA: Diagnosis not present

## 2019-05-01 DIAGNOSIS — E785 Hyperlipidemia, unspecified: Secondary | ICD-10-CM | POA: Diagnosis not present

## 2019-05-01 DIAGNOSIS — I69354 Hemiplegia and hemiparesis following cerebral infarction affecting left non-dominant side: Secondary | ICD-10-CM | POA: Diagnosis not present

## 2019-05-01 DIAGNOSIS — F0150 Vascular dementia without behavioral disturbance: Secondary | ICD-10-CM | POA: Diagnosis not present

## 2019-05-01 DIAGNOSIS — I69318 Other symptoms and signs involving cognitive functions following cerebral infarction: Secondary | ICD-10-CM | POA: Diagnosis not present

## 2019-05-06 DIAGNOSIS — I10 Essential (primary) hypertension: Secondary | ICD-10-CM | POA: Diagnosis not present

## 2019-05-06 DIAGNOSIS — F0150 Vascular dementia without behavioral disturbance: Secondary | ICD-10-CM | POA: Diagnosis not present

## 2019-05-06 DIAGNOSIS — I69354 Hemiplegia and hemiparesis following cerebral infarction affecting left non-dominant side: Secondary | ICD-10-CM | POA: Diagnosis not present

## 2019-05-06 DIAGNOSIS — E785 Hyperlipidemia, unspecified: Secondary | ICD-10-CM | POA: Diagnosis not present

## 2019-05-06 DIAGNOSIS — I69318 Other symptoms and signs involving cognitive functions following cerebral infarction: Secondary | ICD-10-CM | POA: Diagnosis not present

## 2019-05-06 DIAGNOSIS — I69391 Dysphagia following cerebral infarction: Secondary | ICD-10-CM | POA: Diagnosis not present

## 2019-05-07 DIAGNOSIS — I69318 Other symptoms and signs involving cognitive functions following cerebral infarction: Secondary | ICD-10-CM | POA: Diagnosis not present

## 2019-05-07 DIAGNOSIS — E785 Hyperlipidemia, unspecified: Secondary | ICD-10-CM | POA: Diagnosis not present

## 2019-05-07 DIAGNOSIS — I10 Essential (primary) hypertension: Secondary | ICD-10-CM | POA: Diagnosis not present

## 2019-05-07 DIAGNOSIS — F0150 Vascular dementia without behavioral disturbance: Secondary | ICD-10-CM | POA: Diagnosis not present

## 2019-05-07 DIAGNOSIS — I69354 Hemiplegia and hemiparesis following cerebral infarction affecting left non-dominant side: Secondary | ICD-10-CM | POA: Diagnosis not present

## 2019-05-07 DIAGNOSIS — I69391 Dysphagia following cerebral infarction: Secondary | ICD-10-CM | POA: Diagnosis not present

## 2019-05-10 DIAGNOSIS — M62421 Contracture of muscle, right upper arm: Secondary | ICD-10-CM | POA: Diagnosis not present

## 2019-05-10 DIAGNOSIS — M62422 Contracture of muscle, left upper arm: Secondary | ICD-10-CM | POA: Diagnosis not present

## 2019-05-10 DIAGNOSIS — D509 Iron deficiency anemia, unspecified: Secondary | ICD-10-CM | POA: Diagnosis not present

## 2019-05-10 DIAGNOSIS — F0150 Vascular dementia without behavioral disturbance: Secondary | ICD-10-CM | POA: Diagnosis not present

## 2019-05-10 DIAGNOSIS — J309 Allergic rhinitis, unspecified: Secondary | ICD-10-CM | POA: Diagnosis not present

## 2019-05-10 DIAGNOSIS — M62461 Contracture of muscle, right lower leg: Secondary | ICD-10-CM | POA: Diagnosis not present

## 2019-05-10 DIAGNOSIS — R918 Other nonspecific abnormal finding of lung field: Secondary | ICD-10-CM | POA: Diagnosis not present

## 2019-05-10 DIAGNOSIS — I69391 Dysphagia following cerebral infarction: Secondary | ICD-10-CM | POA: Diagnosis not present

## 2019-05-10 DIAGNOSIS — I69318 Other symptoms and signs involving cognitive functions following cerebral infarction: Secondary | ICD-10-CM | POA: Diagnosis not present

## 2019-05-10 DIAGNOSIS — C61 Malignant neoplasm of prostate: Secondary | ICD-10-CM | POA: Diagnosis not present

## 2019-05-10 DIAGNOSIS — L89151 Pressure ulcer of sacral region, stage 1: Secondary | ICD-10-CM | POA: Diagnosis not present

## 2019-05-10 DIAGNOSIS — I10 Essential (primary) hypertension: Secondary | ICD-10-CM | POA: Diagnosis not present

## 2019-05-10 DIAGNOSIS — M62462 Contracture of muscle, left lower leg: Secondary | ICD-10-CM | POA: Diagnosis not present

## 2019-05-10 DIAGNOSIS — I69354 Hemiplegia and hemiparesis following cerebral infarction affecting left non-dominant side: Secondary | ICD-10-CM | POA: Diagnosis not present

## 2019-05-10 DIAGNOSIS — E785 Hyperlipidemia, unspecified: Secondary | ICD-10-CM | POA: Diagnosis not present

## 2019-05-11 DIAGNOSIS — F0150 Vascular dementia without behavioral disturbance: Secondary | ICD-10-CM | POA: Diagnosis not present

## 2019-05-11 DIAGNOSIS — I69318 Other symptoms and signs involving cognitive functions following cerebral infarction: Secondary | ICD-10-CM | POA: Diagnosis not present

## 2019-05-11 DIAGNOSIS — I69391 Dysphagia following cerebral infarction: Secondary | ICD-10-CM | POA: Diagnosis not present

## 2019-05-11 DIAGNOSIS — I69354 Hemiplegia and hemiparesis following cerebral infarction affecting left non-dominant side: Secondary | ICD-10-CM | POA: Diagnosis not present

## 2019-05-11 DIAGNOSIS — I10 Essential (primary) hypertension: Secondary | ICD-10-CM | POA: Diagnosis not present

## 2019-05-11 DIAGNOSIS — E785 Hyperlipidemia, unspecified: Secondary | ICD-10-CM | POA: Diagnosis not present

## 2019-05-13 DIAGNOSIS — F0150 Vascular dementia without behavioral disturbance: Secondary | ICD-10-CM | POA: Diagnosis not present

## 2019-05-13 DIAGNOSIS — I69354 Hemiplegia and hemiparesis following cerebral infarction affecting left non-dominant side: Secondary | ICD-10-CM | POA: Diagnosis not present

## 2019-05-13 DIAGNOSIS — I69391 Dysphagia following cerebral infarction: Secondary | ICD-10-CM | POA: Diagnosis not present

## 2019-05-13 DIAGNOSIS — E785 Hyperlipidemia, unspecified: Secondary | ICD-10-CM | POA: Diagnosis not present

## 2019-05-13 DIAGNOSIS — I10 Essential (primary) hypertension: Secondary | ICD-10-CM | POA: Diagnosis not present

## 2019-05-13 DIAGNOSIS — I69318 Other symptoms and signs involving cognitive functions following cerebral infarction: Secondary | ICD-10-CM | POA: Diagnosis not present

## 2019-05-14 DIAGNOSIS — I10 Essential (primary) hypertension: Secondary | ICD-10-CM | POA: Diagnosis not present

## 2019-05-14 DIAGNOSIS — I69391 Dysphagia following cerebral infarction: Secondary | ICD-10-CM | POA: Diagnosis not present

## 2019-05-14 DIAGNOSIS — F0150 Vascular dementia without behavioral disturbance: Secondary | ICD-10-CM | POA: Diagnosis not present

## 2019-05-14 DIAGNOSIS — I69354 Hemiplegia and hemiparesis following cerebral infarction affecting left non-dominant side: Secondary | ICD-10-CM | POA: Diagnosis not present

## 2019-05-14 DIAGNOSIS — I69318 Other symptoms and signs involving cognitive functions following cerebral infarction: Secondary | ICD-10-CM | POA: Diagnosis not present

## 2019-05-14 DIAGNOSIS — E785 Hyperlipidemia, unspecified: Secondary | ICD-10-CM | POA: Diagnosis not present

## 2019-05-15 DIAGNOSIS — E785 Hyperlipidemia, unspecified: Secondary | ICD-10-CM | POA: Diagnosis not present

## 2019-05-15 DIAGNOSIS — I69391 Dysphagia following cerebral infarction: Secondary | ICD-10-CM | POA: Diagnosis not present

## 2019-05-15 DIAGNOSIS — I10 Essential (primary) hypertension: Secondary | ICD-10-CM | POA: Diagnosis not present

## 2019-05-15 DIAGNOSIS — I69354 Hemiplegia and hemiparesis following cerebral infarction affecting left non-dominant side: Secondary | ICD-10-CM | POA: Diagnosis not present

## 2019-05-15 DIAGNOSIS — I69318 Other symptoms and signs involving cognitive functions following cerebral infarction: Secondary | ICD-10-CM | POA: Diagnosis not present

## 2019-05-15 DIAGNOSIS — F0150 Vascular dementia without behavioral disturbance: Secondary | ICD-10-CM | POA: Diagnosis not present

## 2019-05-16 ENCOUNTER — Telehealth: Payer: Self-pay

## 2019-05-16 DIAGNOSIS — I69391 Dysphagia following cerebral infarction: Secondary | ICD-10-CM | POA: Diagnosis not present

## 2019-05-16 DIAGNOSIS — F0150 Vascular dementia without behavioral disturbance: Secondary | ICD-10-CM | POA: Diagnosis not present

## 2019-05-16 DIAGNOSIS — I69354 Hemiplegia and hemiparesis following cerebral infarction affecting left non-dominant side: Secondary | ICD-10-CM | POA: Diagnosis not present

## 2019-05-16 DIAGNOSIS — I10 Essential (primary) hypertension: Secondary | ICD-10-CM | POA: Diagnosis not present

## 2019-05-16 DIAGNOSIS — I69318 Other symptoms and signs involving cognitive functions following cerebral infarction: Secondary | ICD-10-CM | POA: Diagnosis not present

## 2019-05-16 DIAGNOSIS — E785 Hyperlipidemia, unspecified: Secondary | ICD-10-CM | POA: Diagnosis not present

## 2019-05-16 NOTE — Telephone Encounter (Signed)
you mind calling Brett Mora for me 2022-04-08 his grandson [name] is gonna answer we just wanna check on him and see how he is doing its been a little while  Per family pt is breathing, slowly transitioning, and sleeping that's all he does is sleep. That's all they can say

## 2019-05-17 DIAGNOSIS — I69391 Dysphagia following cerebral infarction: Secondary | ICD-10-CM | POA: Diagnosis not present

## 2019-05-17 DIAGNOSIS — I10 Essential (primary) hypertension: Secondary | ICD-10-CM | POA: Diagnosis not present

## 2019-05-17 DIAGNOSIS — F0150 Vascular dementia without behavioral disturbance: Secondary | ICD-10-CM | POA: Diagnosis not present

## 2019-05-17 DIAGNOSIS — E785 Hyperlipidemia, unspecified: Secondary | ICD-10-CM | POA: Diagnosis not present

## 2019-05-17 DIAGNOSIS — I69318 Other symptoms and signs involving cognitive functions following cerebral infarction: Secondary | ICD-10-CM | POA: Diagnosis not present

## 2019-05-17 DIAGNOSIS — I69354 Hemiplegia and hemiparesis following cerebral infarction affecting left non-dominant side: Secondary | ICD-10-CM | POA: Diagnosis not present

## 2019-05-19 DIAGNOSIS — I69318 Other symptoms and signs involving cognitive functions following cerebral infarction: Secondary | ICD-10-CM | POA: Diagnosis not present

## 2019-05-19 DIAGNOSIS — I69391 Dysphagia following cerebral infarction: Secondary | ICD-10-CM | POA: Diagnosis not present

## 2019-05-19 DIAGNOSIS — F0150 Vascular dementia without behavioral disturbance: Secondary | ICD-10-CM | POA: Diagnosis not present

## 2019-05-19 DIAGNOSIS — E785 Hyperlipidemia, unspecified: Secondary | ICD-10-CM | POA: Diagnosis not present

## 2019-05-19 DIAGNOSIS — I10 Essential (primary) hypertension: Secondary | ICD-10-CM | POA: Diagnosis not present

## 2019-05-19 DIAGNOSIS — I69354 Hemiplegia and hemiparesis following cerebral infarction affecting left non-dominant side: Secondary | ICD-10-CM | POA: Diagnosis not present

## 2019-05-21 ENCOUNTER — Telehealth: Payer: Self-pay | Admitting: Nurse Practitioner

## 2019-05-21 NOTE — Telephone Encounter (Signed)
Called patient home to express condolences to family on his passing, no answer and unable to leave a message.

## 2019-05-22 ENCOUNTER — Ambulatory Visit: Payer: Medicare Other

## 2019-05-22 ENCOUNTER — Ambulatory Visit: Payer: Medicare Other | Admitting: Nurse Practitioner

## 2019-06-10 DEATH — deceased

## 2019-06-26 ENCOUNTER — Encounter: Payer: Self-pay | Admitting: Nurse Practitioner

## 2019-06-26 ENCOUNTER — Telehealth: Payer: Self-pay

## 2019-06-26 NOTE — Telephone Encounter (Signed)
Per JM type up letter for pt grandson stating he was the pt primary caregiver  Spoke with grandson [name] he will stop by for letter. Letter will be upfront

## 2019-06-26 NOTE — Telephone Encounter (Signed)
You can provide a letter stating he was the primary caregiver for his grandfather who has since passed away.

## 2019-06-26 NOTE — Telephone Encounter (Signed)
[name] needs a letter for unemployment stating he was the care taker for pt. Pt daughter stated you know [name] and knows he took care of the pt [phone]

## 2019-11-11 IMAGING — CR DG SHOULDER 2+V*L*
2 series · 2 of 2 positions shown · non-contrast
Comparison: None.

CLINICAL DATA: Several falls lately.  Left shoulder pain.

EXAM:
LEFT SHOULDER - 2+ VIEW

[x shoulder ap left (1 of 2)]
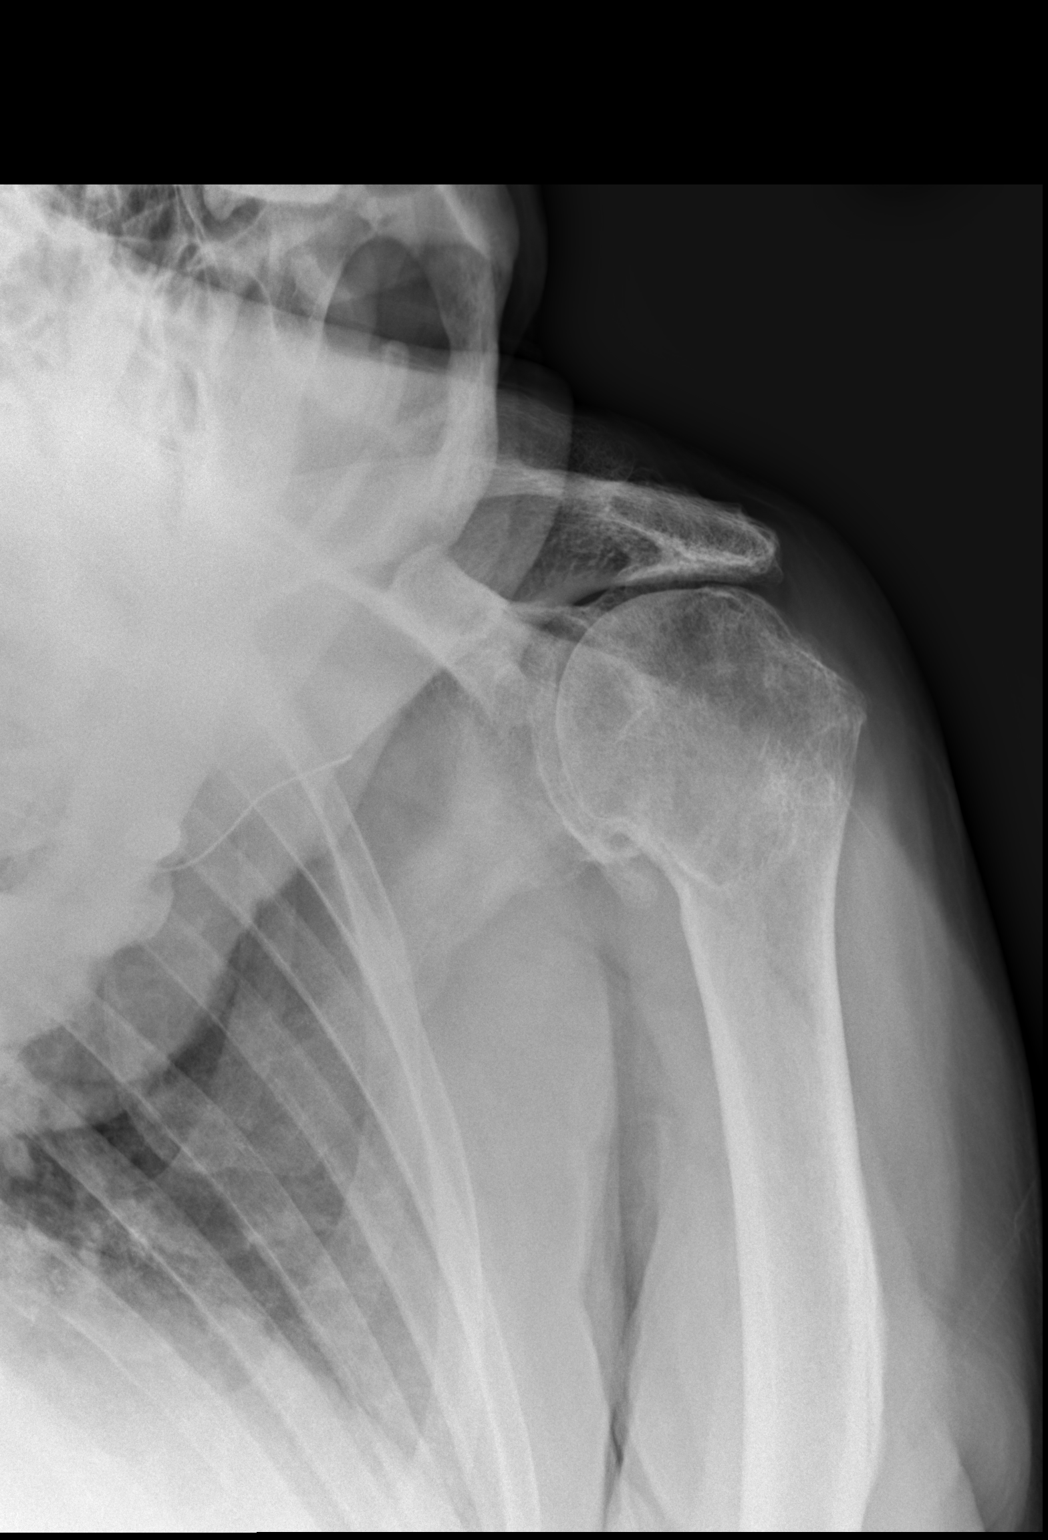

[x shoulder ap left (2 of 2)]
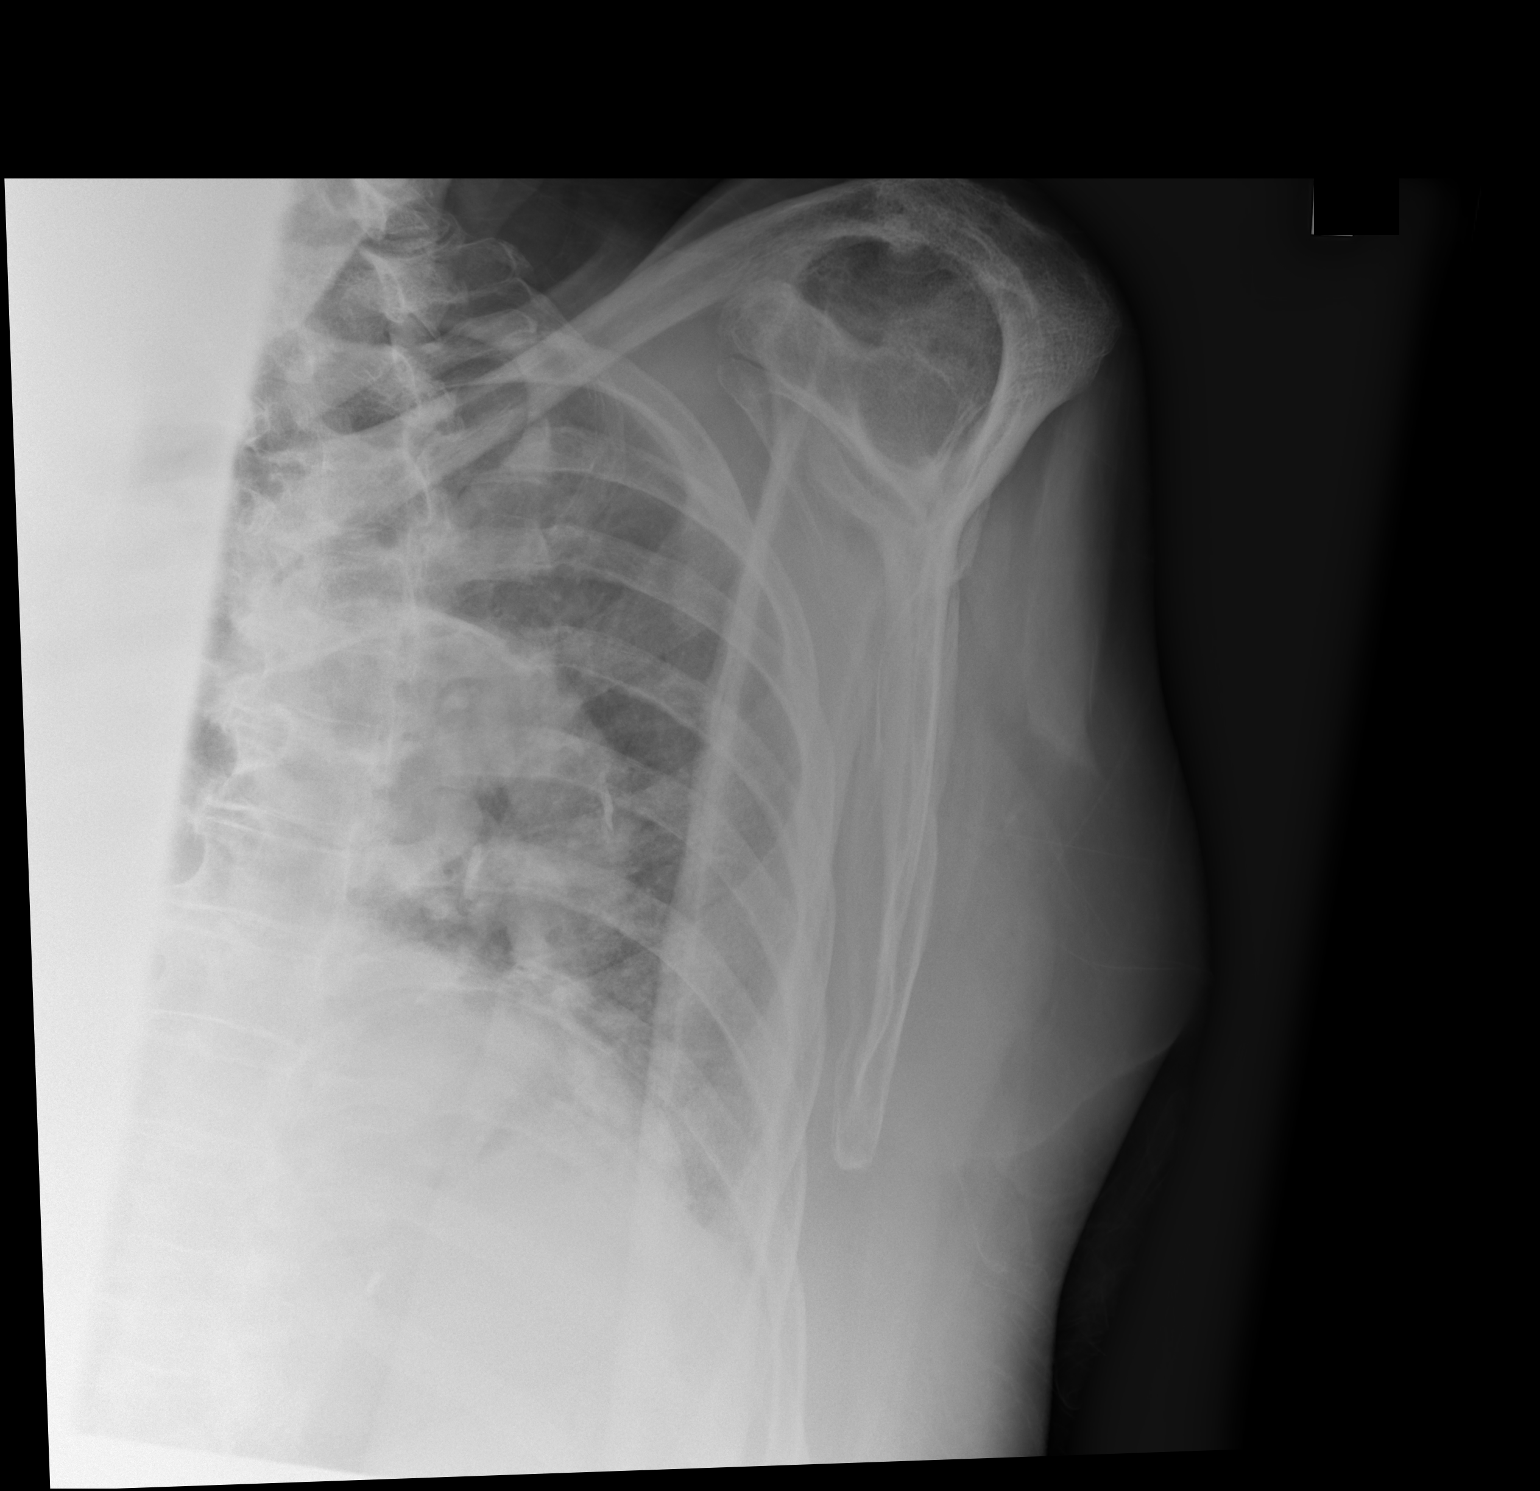

[2 of 2 positions shown; findings below may reference images not displayed]

FINDINGS: No convincing fracture.  No dislocation.

Narrow glenohumeral joint with marginal osteophytes. There is also
narrowing of the subacromial space with a subacromial spur. AC joint
not well-defined but appears normally aligned.

Soft tissues are unremarkable.
IMPRESSION: 1. No fracture or dislocation.
2. Arthropathic changes of the glenohumeral joint.
3. Findings consistent with a chronic full-thickness rotator cuff
tear with significant narrowing of the subacromial space as well as
a subacromial spur.
